# Patient Record
Sex: Female | Born: 1970
Health system: Southern US, Community
[De-identification: ages and names within clinical notes are randomized; demographics above are authoritative.]

## PROBLEM LIST (undated history)

## (undated) DIAGNOSIS — F419 Anxiety disorder, unspecified: Secondary | ICD-10-CM

## (undated) DIAGNOSIS — R519 Headache, unspecified: Secondary | ICD-10-CM

## (undated) DIAGNOSIS — G43909 Migraine, unspecified, not intractable, without status migrainosus: Secondary | ICD-10-CM

## (undated) DIAGNOSIS — E039 Hypothyroidism, unspecified: Secondary | ICD-10-CM

## (undated) HISTORY — PX: LUMBAR FUSION: SHX111

## (undated) HISTORY — PX: BACK SURGERY: SHX140

---

## 2001-09-11 HISTORY — PX: LUMBAR FUSION: SHX111

## 2001-11-04 ENCOUNTER — Other Ambulatory Visit: Admission: RE | Admit: 2001-11-04 | Discharge: 2001-11-04 | Payer: Self-pay | Admitting: Gynecology

## 2002-01-20 ENCOUNTER — Other Ambulatory Visit: Admission: RE | Admit: 2002-01-20 | Discharge: 2002-01-20 | Payer: Self-pay | Admitting: Gynecology

## 2002-06-24 ENCOUNTER — Other Ambulatory Visit: Admission: RE | Admit: 2002-06-24 | Discharge: 2002-06-24 | Payer: Self-pay | Admitting: Gynecology

## 2003-01-28 ENCOUNTER — Encounter: Payer: Self-pay | Admitting: *Deleted

## 2003-01-28 ENCOUNTER — Encounter: Admission: RE | Admit: 2003-01-28 | Discharge: 2003-01-28 | Payer: Self-pay | Admitting: *Deleted

## 2003-02-20 ENCOUNTER — Encounter: Payer: Self-pay | Admitting: Neurosurgery

## 2003-02-20 ENCOUNTER — Inpatient Hospital Stay (HOSPITAL_COMMUNITY): Admission: RE | Admit: 2003-02-20 | Discharge: 2003-02-25 | Payer: Self-pay | Admitting: Neurosurgery

## 2003-07-27 ENCOUNTER — Encounter: Admission: RE | Admit: 2003-07-27 | Discharge: 2003-07-27 | Payer: Self-pay | Admitting: Family Medicine

## 2004-08-15 ENCOUNTER — Other Ambulatory Visit: Admission: RE | Admit: 2004-08-15 | Discharge: 2004-08-15 | Payer: Self-pay | Admitting: *Deleted

## 2005-08-17 ENCOUNTER — Other Ambulatory Visit: Admission: RE | Admit: 2005-08-17 | Discharge: 2005-08-17 | Payer: Self-pay | Admitting: Family Medicine

## 2007-01-15 ENCOUNTER — Other Ambulatory Visit: Admission: RE | Admit: 2007-01-15 | Discharge: 2007-01-15 | Payer: Self-pay | Admitting: Family Medicine

## 2008-05-28 ENCOUNTER — Other Ambulatory Visit: Admission: RE | Admit: 2008-05-28 | Discharge: 2008-05-28 | Payer: Self-pay | Admitting: Family Medicine

## 2010-09-30 ENCOUNTER — Emergency Department: Payer: Self-pay | Admitting: Emergency Medicine

## 2011-01-27 NOTE — Discharge Summary (Signed)
   NAME:  Brandi Deleon                           ACCOUNT NO.:  [id number]   MEDICAL RECORD NO.:  [id number]                   PATIENT TYPE:  INP   LOCATION:  3024                                 FACILITY:  [facility]   PHYSICIAN:  Hilda Lias, M.D.                DATE OF BIRTH:  [birth year]   DATE OF ADMISSION:  02/20/2003  DATE OF DISCHARGE:  02/25/2003                                 DISCHARGE SUMMARY   ADMISSION DIAGNOSES:  1. L4-5, L5-S1 spondylolisthesis and herniated disks.  2. Degenerative disk disease.   FINAL DIAGNOSES:  1. L4-5, L5-S1 spondylolisthesis and herniated disks.  2. Degenerative disk disease.   CLINICAL HISTORY:  The patient was admitted by Dr. Venetia Maxon because of back  pain with radiation down to the legs.  X-ray showed spondylolisthesis,  degenerative disk disease at L4-5, L5-S1; surgery was advised.   LABORATORY DATA:  Normal.   HOSPITAL COURSE:  The patient was taken for surgery, and a fissure over L4-5  and L5-S1 was achieved.  The patient __________ prior to surgery, and today  she is doing much better; she is ambulating, she is afebrile, and she wants  to go home.   DISCHARGE MEDICATIONS:  1. Percocet.  2. Diazepam.  3. Flexeril.   DIET:  Regular.   ACTIVITY:  1. She is not to drive.  2. She is not to [place] any lifting.   FOLLOW UP:  She is going to call the office to set up an appointment to be  seen by Dr. Venetia Maxon.                                               Hilda Lias, M.D.    EB/MEDQ  D:  02/25/2003  T:  02/26/2003  Job:  [id number]

## 2011-01-27 NOTE — Op Note (Signed)
NAME:  Brandi Deleon                           ACCOUNT NO.:  [id number]   MEDICAL RECORD NO.:  [id number]                   PATIENT TYPE:  INP   LOCATION:  2899                                 FACILITY:  [facility]   PHYSICIAN:  Danae Orleans. Venetia Maxon, M.D.               DATE OF BIRTH:  [birth year]   DATE OF PROCEDURE:  02/20/2003  DATE OF DISCHARGE:                                 OPERATIVE REPORT   PREOPERATIVE DIAGNOSIS:  L5 spondylolysis, spondylolisthesis at L5-S1 and  retrolisthesis of L4 and L5 with herniated lumbar disk at L5-S1 and L4-5  with degenerative disk disease and lumbar radiculopathy.   POSTOPERATIVE DIAGNOSIS:  L5 spondylolysis, spondylolisthesis at L5-S1 and  retrolisthesis of L4 and L5 with herniated lumbar disk at L5-S1 and L4-5  with degenerative disk disease and lumbar radiculopathy.   PROCEDURE:  1. L5 Gill procedure.  2. L4-5 and L5-S1 diskectomies.  3. Transverse lumbar interbody fusion with Synthes bone allograft dowels (9     mm at L5-S1 and 11 mm at L4-5) with more slight bone autograft.  4. L4, L5, and S1 bilateral pedicle screw fixation.  5. Posterolateral arthrodesis at L4 through S1 levels with V-toss and     morselized autograft.   SURGEON:  Danae Orleans. Venetia Maxon, M.D.   ANESTHESIA:  General endotracheal anesthesia.   ESTIMATED BLOOD LOSS:  550 mL with one unit of Cellsaver blood returned to  the patient.   COMPLICATIONS:  None.   DISPOSITION:  To recovery room.   INDICATIONS FOR PROCEDURE:  The patient is a 40 year old woman with  spondylolysis at L5, retrolisthesis of L4 and L5, herniated disks at L4-5  and L5-S1 levels with severe leg pain, left leg weakness, and cauda equina  syndrome.  It was elected to take her to surgery for lumbar decompression  and fusion.   DESCRIPTION OF PROCEDURE:  The patient was brought to the operating room.  Following the satisfactory and uncomplicated induction of general  endotracheal anesthesia, placement of  intravenous lines along with a Foley  catheter. The patient was placed in prone position on the operating table.  Her low back was then prepped and draped in the usual sterile fashion.  An  area of plane incision was infiltrated with 0.25% Marcaine and 0.5%  lidocaine with 1:200,000 epinephrine.  Incision was made in the midline and  carried through adipose tissues.  The lumbodorsal fascia was incised  bilaterally.  Subperiosteal dissection was performed exposing the L4 and L5  transverse processes and sacral ala.  First retractor was placed to  facilitate exposure. Intraoperative x-ray confirmed correct level with  markers at L4 and L5 transverse processes.  The L5 posterior elements were  not attached and there was clear evidence of spondylolysis of L5.  The  posterior elements were removed with a resultant decompression of the thecal  sac and both L5 nerve roots were then decompressed  as they extended up the  neuroforamen.  A hemilaminectomy of L4 was performed on the left with  exposure of the L4-5 interspace at this level.  Using loupe magnification, a  large free fragment of herniated disk material was removed from within the  axilla of the L5 nerve root on the left and subsequently the L5-S1  interspace was incised and disk material was removed in a piecemeal fashion.  A similar decompression was performed at the L4-5 level.  Using the  fluoroscopy unit to confirm positioning of bone grafts and subsequently with  pedicle screw fixation, a 9 mm Synthes bone dowel was inserted into the  interspace at the L5-S1 level after preparing the interspace appropriately.  Morselized bone autograft was placed overlying this.  Subsequently, at the  L4-5 level, an 11 mm bone graft was placed and countersunk appropriately and  morselized bone autograft was placed overlying this.  Subsequently pedicle  screw fixation was placed with screws at L4, L5, and sacral levels  bilaterally.  5.75 x 40 mm  screws were placed at each level.  Positioning of  screws was confirmed on lateral and AP fluoroscopy.  A 60 mm rod was then  lordosed appropriately and affixed to the screwheads after the V-toss strips  were placed laterally along with the remaining morselized bone autograft. V-  toss strips were reconstituted in marrow blood aspirated from the pedicle  screw tract.  The self-retaining retractor was then removed after the  locking caps were placed on each of the screws and they were locked down in  situ. The retractor was removed.  The lumbodorsal fascia was closed with 1  Vicryl suture. Subcutaneous tissues were reapproximated with 2-0 Vicryl  interrupted inverted sutures.  The skin edges were reapproximated with  interrupted 3-0 Vicryl subcuticular stitch.  The wound was dressed with  Benzoin, Steri-Strips, Telfa gauze and tape.  The patient was extubated in  the operating room and taken to the recovery room in stable and satisfactory  condition having tolerated the procedure well.  Needle, sponge, and  instrument count correct.                                               Danae Orleans. Venetia Maxon, M.D.    JDS/MEDQ  D:  02/20/2003  T:  02/21/2003  Job:  [id number]

## 2012-08-15 ENCOUNTER — Ambulatory Visit: Payer: Self-pay | Admitting: Internal Medicine

## 2012-09-06 ENCOUNTER — Ambulatory Visit: Payer: Self-pay | Admitting: Internal Medicine

## 2019-03-11 ENCOUNTER — Other Ambulatory Visit: Payer: Self-pay | Admitting: Internal Medicine

## 2019-03-11 DIAGNOSIS — Z1231 Encounter for screening mammogram for malignant neoplasm of breast: Secondary | ICD-10-CM

## 2019-04-17 ENCOUNTER — Encounter (HOSPITAL_COMMUNITY): Payer: Self-pay

## 2019-04-17 ENCOUNTER — Ambulatory Visit
Admission: RE | Admit: 2019-04-17 | Discharge: 2019-04-17 | Disposition: A | Discharge status: Home / Self Care | Payer: Commercial Managed Care - PPO | Source: Ambulatory Visit | Attending: Internal Medicine | Admitting: Internal Medicine

## 2019-04-17 DIAGNOSIS — Z1231 Encounter for screening mammogram for malignant neoplasm of breast: Secondary | ICD-10-CM | POA: Diagnosis present

## 2019-07-21 ENCOUNTER — Other Ambulatory Visit: Payer: Self-pay | Admitting: Internal Medicine

## 2019-07-21 DIAGNOSIS — M545 Low back pain, unspecified: Secondary | ICD-10-CM

## 2019-07-29 ENCOUNTER — Other Ambulatory Visit: Payer: Self-pay

## 2019-07-29 ENCOUNTER — Ambulatory Visit
Admission: RE | Admit: 2019-07-29 | Discharge: 2019-07-29 | Disposition: A | Discharge status: Home / Self Care | Payer: Commercial Managed Care - PPO | Source: Ambulatory Visit | Attending: Internal Medicine | Admitting: Internal Medicine

## 2019-07-29 DIAGNOSIS — M545 Low back pain, unspecified: Secondary | ICD-10-CM

## 2019-12-01 ENCOUNTER — Other Ambulatory Visit: Payer: Self-pay | Admitting: Family

## 2019-12-01 DIAGNOSIS — E049 Nontoxic goiter, unspecified: Secondary | ICD-10-CM

## 2019-12-08 ENCOUNTER — Other Ambulatory Visit: Payer: Self-pay

## 2019-12-08 ENCOUNTER — Ambulatory Visit
Admission: RE | Admit: 2019-12-08 | Discharge: 2019-12-08 | Disposition: A | Discharge status: Home / Self Care | Payer: Commercial Managed Care - PPO | Source: Ambulatory Visit | Attending: Family | Admitting: Family

## 2019-12-08 DIAGNOSIS — E049 Nontoxic goiter, unspecified: Secondary | ICD-10-CM

## 2021-04-28 ENCOUNTER — Other Ambulatory Visit: Payer: Self-pay | Admitting: Family

## 2021-04-28 DIAGNOSIS — Z1231 Encounter for screening mammogram for malignant neoplasm of breast: Secondary | ICD-10-CM

## 2021-05-13 ENCOUNTER — Other Ambulatory Visit: Payer: Self-pay

## 2021-05-13 ENCOUNTER — Ambulatory Visit
Admission: RE | Admit: 2021-05-13 | Discharge: 2021-05-13 | Disposition: A | Discharge status: Home / Self Care | Payer: Commercial Managed Care - PPO | Source: Ambulatory Visit | Attending: Family | Admitting: Family

## 2021-05-13 DIAGNOSIS — Z1231 Encounter for screening mammogram for malignant neoplasm of breast: Secondary | ICD-10-CM | POA: Insufficient documentation

## 2022-02-13 ENCOUNTER — Ambulatory Visit
Admission: EM | Admit: 2022-02-13 | Discharge: 2022-02-13 | Disposition: A | Discharge status: Home / Self Care | Payer: Commercial Managed Care - PPO | Attending: Emergency Medicine | Admitting: Emergency Medicine

## 2022-02-13 ENCOUNTER — Other Ambulatory Visit: Payer: Self-pay

## 2022-02-13 ENCOUNTER — Emergency Department: Payer: Commercial Managed Care - PPO

## 2022-02-13 ENCOUNTER — Emergency Department: Payer: Commercial Managed Care - PPO | Admitting: Certified Registered"

## 2022-02-13 ENCOUNTER — Encounter
Admission: EM | Disposition: A | Discharge status: Home / Self Care | Payer: Self-pay | Source: Home / Self Care | Attending: Emergency Medicine

## 2022-02-13 ENCOUNTER — Encounter: Payer: Self-pay | Admitting: Obstetrics and Gynecology

## 2022-02-13 DIAGNOSIS — E669 Obesity, unspecified: Secondary | ICD-10-CM | POA: Diagnosis not present

## 2022-02-13 DIAGNOSIS — N83202 Unspecified ovarian cyst, left side: Secondary | ICD-10-CM | POA: Insufficient documentation

## 2022-02-13 DIAGNOSIS — Z6832 Body mass index (BMI) 32.0-32.9, adult: Secondary | ICD-10-CM | POA: Diagnosis not present

## 2022-02-13 DIAGNOSIS — N8311 Corpus luteum cyst of right ovary: Secondary | ICD-10-CM | POA: Insufficient documentation

## 2022-02-13 DIAGNOSIS — R1031 Right lower quadrant pain: Secondary | ICD-10-CM | POA: Diagnosis not present

## 2022-02-13 DIAGNOSIS — E039 Hypothyroidism, unspecified: Secondary | ICD-10-CM | POA: Diagnosis not present

## 2022-02-13 DIAGNOSIS — N838 Other noninflammatory disorders of ovary, fallopian tube and broad ligament: Secondary | ICD-10-CM | POA: Insufficient documentation

## 2022-02-13 DIAGNOSIS — N83519 Torsion of ovary and ovarian pedicle, unspecified side: Secondary | ICD-10-CM

## 2022-02-13 DIAGNOSIS — N83201 Unspecified ovarian cyst, right side: Secondary | ICD-10-CM | POA: Diagnosis present

## 2022-02-13 DIAGNOSIS — N83511 Torsion of right ovary and ovarian pedicle: Secondary | ICD-10-CM | POA: Diagnosis present

## 2022-02-13 DIAGNOSIS — D270 Benign neoplasm of right ovary: Secondary | ICD-10-CM | POA: Insufficient documentation

## 2022-02-13 HISTORY — DX: Hypothyroidism, unspecified: E03.9

## 2022-02-13 HISTORY — PX: OVARIAN CYST REMOVAL: SHX89

## 2022-02-13 HISTORY — PX: CYSTOSCOPY: SHX5120

## 2022-02-13 HISTORY — PX: ROBOTIC ASSISTED SALPINGO OOPHERECTOMY: SHX6082

## 2022-02-13 LAB — COMPREHENSIVE METABOLIC PANEL
ALT: 18 U/L (ref 0–44)
AST: 20 U/L (ref 15–41)
Albumin: 4.2 g/dL (ref 3.5–5.0)
Alkaline Phosphatase: 55 U/L (ref 38–126)
Anion gap: 6 (ref 5–15)
BUN: 14 mg/dL (ref 6–20)
CO2: 24 mmol/L (ref 22–32)
Calcium: 9.4 mg/dL (ref 8.9–10.3)
Chloride: 107 mmol/L (ref 98–111)
Creatinine, Ser: 0.79 mg/dL (ref 0.44–1.00)
GFR, Estimated: >60 mL/min (ref >60)
Glucose, Bld: 143 mg/dL — ABNORMAL HIGH (ref 70–99)
Potassium: 4 mmol/L (ref 3.5–5.1)
Sodium: 137 mmol/L (ref 135–145)
Total Bilirubin: 0.9 mg/dL (ref 0.3–1.2)
Total Protein: 7.8 g/dL (ref 6.5–8.1)

## 2022-02-13 LAB — URINALYSIS, ROUTINE W REFLEX MICROSCOPIC
Bacteria, UA: NONE SEEN
Bilirubin Urine: NEGATIVE
Glucose, UA: NEGATIVE mg/dL
Ketones, ur: 20 mg/dL — AB
Leukocytes,Ua: NEGATIVE
Nitrite: NEGATIVE
Protein, ur: NEGATIVE mg/dL
Specific Gravity, Urine: >1.046 — ABNORMAL HIGH (ref 1.005–1.030)
pH: 5 (ref 5.0–8.0)

## 2022-02-13 LAB — CBC
HCT: 47.1 % — ABNORMAL HIGH (ref 36.0–46.0)
Hemoglobin: 15.8 g/dL — ABNORMAL HIGH (ref 12.0–15.0)
MCH: 28.6 pg (ref 26.0–34.0)
MCHC: 33.5 g/dL (ref 30.0–36.0)
MCV: 85.3 fL (ref 80.0–100.0)
Platelets: 278 10*3/uL (ref 150–400)
RBC: 5.52 MIL/uL — ABNORMAL HIGH (ref 3.87–5.11)
RDW: 12.5 % (ref 11.5–15.5)
WBC: 8.5 10*3/uL (ref 4.0–10.5)
nRBC: 0 % (ref 0.0–0.2)

## 2022-02-13 LAB — HCG, QUANTITATIVE, PREGNANCY: hCG, Beta Chain, Quant, S: 1 m[IU]/mL (ref <5)

## 2022-02-13 LAB — LIPASE, BLOOD: Lipase: 32 U/L (ref 11–51)

## 2022-02-13 SURGERY — SALPINGO-OOPHORECTOMY, ROBOT-ASSISTED
Anesthesia: General | Site: Abdomen | Laterality: Right

## 2022-02-13 MED ORDER — MIDAZOLAM HCL 2 MG/2ML IJ SOLN
INTRAMUSCULAR | Status: DC | PRN
  Administered 2022-02-13: 2 mg via INTRAVENOUS

## 2022-02-13 MED ORDER — FENTANYL CITRATE (PF) 100 MCG/2ML IJ SOLN
INTRAMUSCULAR | Status: AC
  Filled 2022-02-13: qty 2

## 2022-02-13 MED ORDER — HYDROMORPHONE HCL 1 MG/ML IJ SOLN
INTRAMUSCULAR | Status: AC
  Filled 2022-02-13: qty 0.5

## 2022-02-13 MED ORDER — HYDROMORPHONE HCL 1 MG/ML IJ SOLN
0.5000 mg | Freq: Once | INTRAMUSCULAR | Status: AC
  Administered 2022-02-13: 0.5 mg via INTRAVENOUS
  Filled 2022-02-13: qty 0.5

## 2022-02-13 MED ORDER — MIDAZOLAM HCL 2 MG/2ML IJ SOLN
INTRAMUSCULAR | Status: AC
  Filled 2022-02-13: qty 2

## 2022-02-13 MED ORDER — IBUPROFEN 600 MG PO TABS: 600.0000 mg | ORAL_TABLET | Freq: Four times a day (QID) | ORAL | 0 refills | Status: DC

## 2022-02-13 MED ORDER — KETAMINE HCL 10 MG/ML IJ SOLN
INTRAMUSCULAR | Status: DC | PRN
  Administered 2022-02-13 (×2): 10 mg via INTRAVENOUS
  Administered 2022-02-13 (×2): 20 mg via INTRAVENOUS
  Administered 2022-02-13: 10 mg via INTRAVENOUS

## 2022-02-13 MED ORDER — OXYCODONE HCL 5 MG PO TABS: 5.0000 mg | ORAL_TABLET | Freq: Once | ORAL | Status: DC | PRN

## 2022-02-13 MED ORDER — ONDANSETRON HCL 4 MG/2ML IJ SOLN
4.0000 mg | Freq: Once | INTRAMUSCULAR | Status: AC
  Administered 2022-02-13: 4 mg via INTRAVENOUS
  Filled 2022-02-13: qty 2

## 2022-02-13 MED ORDER — FENTANYL CITRATE (PF) 100 MCG/2ML IJ SOLN
INTRAMUSCULAR | Status: AC
  Administered 2022-02-13: 25 ug via INTRAVENOUS
  Filled 2022-02-13: qty 2

## 2022-02-13 MED ORDER — SODIUM CHLORIDE 0.9 % IR SOLN
Status: DC | PRN
  Administered 2022-02-13: 100 mL

## 2022-02-13 MED ORDER — POVIDONE-IODINE 10 % EX SWAB: 2.0000 "application " | Freq: Once | CUTANEOUS | Status: DC

## 2022-02-13 MED ORDER — DEXAMETHASONE SODIUM PHOSPHATE 10 MG/ML IJ SOLN
INTRAMUSCULAR | Status: DC | PRN
  Administered 2022-02-13: 8 mg via INTRAVENOUS

## 2022-02-13 MED ORDER — SUGAMMADEX SODIUM 500 MG/5ML IV SOLN
INTRAVENOUS | Status: DC | PRN
  Administered 2022-02-13: 450 mg via INTRAVENOUS

## 2022-02-13 MED ORDER — SCOPOLAMINE 1 MG/3DAYS TD PT72
1.0000 | MEDICATED_PATCH | TRANSDERMAL | Status: DC
  Administered 2022-02-13: 1.5 mg via TRANSDERMAL

## 2022-02-13 MED ORDER — FENTANYL CITRATE (PF) 100 MCG/2ML IJ SOLN
INTRAMUSCULAR | Status: DC | PRN
Start: 2022-02-13 — End: 2022-02-13
  Administered 2022-02-13 (×4): 25 ug via INTRAVENOUS

## 2022-02-13 MED ORDER — ONDANSETRON HCL 4 MG/2ML IJ SOLN: 4.0000 mg | Freq: Once | INTRAMUSCULAR | Status: DC | PRN

## 2022-02-13 MED ORDER — PROPOFOL 10 MG/ML IV BOLUS
INTRAVENOUS | Status: DC | PRN
  Administered 2022-02-13: 160 mg via INTRAVENOUS

## 2022-02-13 MED ORDER — ONDANSETRON HCL 4 MG/2ML IJ SOLN
INTRAMUSCULAR | Status: DC | PRN
  Administered 2022-02-13: 4 mg via INTRAVENOUS

## 2022-02-13 MED ORDER — HYDROMORPHONE HCL 1 MG/ML IJ SOLN
INTRAMUSCULAR | Status: DC | PRN
  Administered 2022-02-13 (×2): .5 mg via INTRAVENOUS

## 2022-02-13 MED ORDER — MORPHINE SULFATE (PF) 4 MG/ML IV SOLN: 4.0000 mg | Freq: Once | INTRAVENOUS | Status: DC

## 2022-02-13 MED ORDER — LIDOCAINE HCL (CARDIAC) PF 100 MG/5ML IV SOSY
PREFILLED_SYRINGE | INTRAVENOUS | Status: DC | PRN
  Administered 2022-02-13: 100 mg via INTRAVENOUS

## 2022-02-13 MED ORDER — KETAMINE HCL 50 MG/5ML IJ SOSY
PREFILLED_SYRINGE | INTRAMUSCULAR | Status: AC
Start: 2022-02-13 — End: ?
  Filled 2022-02-13: qty 5

## 2022-02-13 MED ORDER — LACTATED RINGERS IV SOLN: INTRAVENOUS | Status: DC

## 2022-02-13 MED ORDER — ROCURONIUM BROMIDE 100 MG/10ML IV SOLN
INTRAVENOUS | Status: DC | PRN
  Administered 2022-02-13: 30 mg via INTRAVENOUS
  Administered 2022-02-13 (×3): 20 mg via INTRAVENOUS
  Administered 2022-02-13: 50 mg via INTRAVENOUS

## 2022-02-13 MED ORDER — ACETAMINOPHEN 10 MG/ML IV SOLN
INTRAVENOUS | Status: DC | PRN
  Administered 2022-02-13: 1000 mg via INTRAVENOUS

## 2022-02-13 MED ORDER — BUPIVACAINE HCL 0.5 % IJ SOLN
INTRAMUSCULAR | Status: DC | PRN
  Administered 2022-02-13: 10 mL

## 2022-02-13 MED ORDER — METOPROLOL TARTRATE 5 MG/5ML IV SOLN
INTRAVENOUS | Status: DC | PRN
  Administered 2022-02-13: 1 mg via INTRAVENOUS
  Administered 2022-02-13: 2 mg via INTRAVENOUS
  Administered 2022-02-13 (×2): 1 mg via INTRAVENOUS

## 2022-02-13 MED ORDER — SUGAMMADEX SODIUM 500 MG/5ML IV SOLN
INTRAVENOUS | Status: AC
Start: 2022-02-13 — End: ?
  Filled 2022-02-13: qty 5

## 2022-02-13 MED ORDER — ONDANSETRON 4 MG PO TBDP: 4.0000 mg | ORAL_TABLET | Freq: Four times a day (QID) | ORAL | 0 refills | Status: DC | PRN

## 2022-02-13 MED ORDER — PROPOFOL 500 MG/50ML IV EMUL
INTRAVENOUS | Status: DC | PRN
  Administered 2022-02-13: 150 ug/kg/min via INTRAVENOUS

## 2022-02-13 MED ORDER — LACTATED RINGERS IV BOLUS
1000.0000 mL | Freq: Once | INTRAVENOUS | Status: AC
  Administered 2022-02-13: 1000 mL via INTRAVENOUS

## 2022-02-13 MED ORDER — FENTANYL CITRATE PF 50 MCG/ML IJ SOSY
50.0000 ug | PREFILLED_SYRINGE | INTRAMUSCULAR | Status: AC | PRN
  Administered 2022-02-13 (×2): 50 ug via INTRAVENOUS
  Filled 2022-02-13 (×2): qty 1

## 2022-02-13 MED ORDER — FENTANYL CITRATE (PF) 100 MCG/2ML IJ SOLN
25.0000 ug | INTRAMUSCULAR | Status: DC | PRN
  Administered 2022-02-13: 25 ug via INTRAVENOUS

## 2022-02-13 MED ORDER — PROPOFOL 1000 MG/100ML IV EMUL
INTRAVENOUS | Status: AC
Start: 2022-02-13 — End: ?
  Filled 2022-02-13: qty 100

## 2022-02-13 MED ORDER — SILVER NITRATE-POT NITRATE 75-25 % EX MISC
CUTANEOUS | Status: DC | PRN
  Administered 2022-02-13: 2 via TOPICAL

## 2022-02-13 MED ORDER — HYDROMORPHONE HCL 1 MG/ML IJ SOLN
INTRAMUSCULAR | Status: AC
  Filled 2022-02-13: qty 1

## 2022-02-13 MED ORDER — OXYCODONE-ACETAMINOPHEN 5-325 MG PO TABS: 1.0000 | ORAL_TABLET | Freq: Four times a day (QID) | ORAL | 0 refills | Status: AC | PRN

## 2022-02-13 MED ORDER — IOHEXOL 300 MG/ML  SOLN
100.0000 mL | Freq: Once | INTRAMUSCULAR | Status: AC | PRN
  Administered 2022-02-13: 100 mL via INTRAVENOUS

## 2022-02-13 MED ORDER — KETAMINE HCL 50 MG/5ML IJ SOSY
PREFILLED_SYRINGE | INTRAMUSCULAR | Status: AC
  Filled 2022-02-13: qty 5

## 2022-02-13 MED ORDER — HYDROMORPHONE HCL 1 MG/ML IJ SOLN
0.5000 mg | Freq: Once | INTRAMUSCULAR | Status: AC
  Administered 2022-02-13: 0.5 mg via INTRAVENOUS

## 2022-02-13 MED ORDER — PHENYLEPHRINE HCL (PRESSORS) 10 MG/ML IV SOLN
INTRAVENOUS | Status: DC | PRN
  Administered 2022-02-13: 80 ug via INTRAVENOUS

## 2022-02-13 MED ORDER — FENTANYL CITRATE PF 50 MCG/ML IJ SOSY
50.0000 ug | PREFILLED_SYRINGE | Freq: Once | INTRAMUSCULAR | Status: AC
  Administered 2022-02-13: 50 ug via INTRAVENOUS

## 2022-02-13 MED ORDER — OXYCODONE HCL 5 MG/5ML PO SOLN: 5.0000 mg | Freq: Once | ORAL | Status: DC | PRN

## 2022-02-13 MED ORDER — ACETAMINOPHEN 10 MG/ML IV SOLN: 1000.0000 mg | Freq: Once | INTRAVENOUS | Status: DC | PRN

## 2022-02-13 SURGICAL SUPPLY — 91 items
ADH SKN CLS APL DERMABOND .7 (GAUZE/BANDAGES/DRESSINGS) ×3
ANCHOR TIS RET SYS 1550ML (BAG) ×1 IMPLANT
APL PRP STRL LF DISP 70% ISPRP (MISCELLANEOUS) ×3
BACTOSHIELD CHG 4% 4OZ (MISCELLANEOUS) ×1
BAG DRN RND TRDRP ANRFLXCHMBR (UROLOGICAL SUPPLIES) ×3
BAG SPEC RTRVL C1550 25.4 (BAG) ×3
BAG URINE DRAIN 2000ML AR STRL (UROLOGICAL SUPPLIES) ×4 IMPLANT
BASIN KIT SINGLE STR (MISCELLANEOUS) ×4 IMPLANT
BLADE SURG SZ11 CARB STEEL (BLADE) ×4 IMPLANT
CANNULA CAP OBTURATR AIRSEAL 8 (CAP) ×4 IMPLANT
CANNULA REDUC XI 12-8 STAPL (CANNULA) ×4
CANNULA REDUCER 12-8 DVNC XI (CANNULA) IMPLANT
CATH FOLEY 2WAY  5CC 16FR (CATHETERS) ×4
CATH FOLEY 2WAY 5CC 16FR (CATHETERS) ×3
CATH URTH 16FR FL 2W BLN LF (CATHETERS) ×3 IMPLANT
CHLORAPREP W/TINT 26 (MISCELLANEOUS) ×4 IMPLANT
COVER MAYO STAND REUSABLE (DRAPES) ×4 IMPLANT
COVER TIP SHEARS 8 DVNC (MISCELLANEOUS) ×3 IMPLANT
COVER TIP SHEARS 8MM DA VINCI (MISCELLANEOUS) ×4
DEFOGGER SCOPE WARMER CLEARIFY (MISCELLANEOUS) ×4 IMPLANT
DERMABOND ADVANCED (GAUZE/BANDAGES/DRESSINGS) ×1
DERMABOND ADVANCED .7 DNX12 (GAUZE/BANDAGES/DRESSINGS) ×3 IMPLANT
DRAPE 3/4 80X56 (DRAPES) IMPLANT
DRAPE ARM DVNC X/XI (DISPOSABLE) ×12 IMPLANT
DRAPE DA VINCI XI ARM (DISPOSABLE) ×16
DRAPE LEGGINS SURG 28X43 STRL (DRAPES) ×4 IMPLANT
DRAPE ROBOT W/ LEGGING 30X125 (DRAPES) ×4 IMPLANT
DRAPE UNDER BUTTOCK W/FLU (DRAPES) ×4 IMPLANT
DRSG TEGADERM 2-3/8X2-3/4 SM (GAUZE/BANDAGES/DRESSINGS) ×1 IMPLANT
ELECT REM PT RETURN 9FT ADLT (ELECTROSURGICAL) ×4
ELECTRODE REM PT RTRN 9FT ADLT (ELECTROSURGICAL) ×3 IMPLANT
GLOVE BIO SURGEON STRL SZ7 (GLOVE) ×12 IMPLANT
GLOVE SURG UNDER POLY LF SZ7.5 (GLOVE) ×12 IMPLANT
GOWN STRL REUS W/ TWL LRG LVL3 (GOWN DISPOSABLE) ×18 IMPLANT
GOWN STRL REUS W/TWL LRG LVL3 (GOWN DISPOSABLE) ×24
GRASPER SUT TROCAR 14GX15 (MISCELLANEOUS) IMPLANT
IRRIGATION STRYKERFLOW (MISCELLANEOUS) IMPLANT
IRRIGATOR STRYKERFLOW (MISCELLANEOUS) ×4
IV NS 1000ML (IV SOLUTION) ×4
IV NS 1000ML BAXH (IV SOLUTION) ×3 IMPLANT
KIT PINK PAD W/HEAD ARE REST (MISCELLANEOUS) ×4 IMPLANT
KIT PINK PAD W/HEAD ARM REST (MISCELLANEOUS) ×3 IMPLANT
KIT TURNOVER CYSTO (KITS) ×4 IMPLANT
LABEL OR SOLS (LABEL) ×4 IMPLANT
MANIFOLD NEPTUNE II (INSTRUMENTS) ×4 IMPLANT
MANIPULATOR URINE KRONNER 5 (MISCELLANEOUS) IMPLANT
MANIPULATOR VCARE LG CRV RETR (MISCELLANEOUS) IMPLANT
MANIPULATOR VCARE SML CRV RETR (MISCELLANEOUS) IMPLANT
MANIPULATOR VCARE STD CRV RETR (MISCELLANEOUS) IMPLANT
NEEDLE HYPO 22GX1.5 SAFETY (NEEDLE) ×4 IMPLANT
NS IRRIG 1000ML POUR BTL (IV SOLUTION) ×8 IMPLANT
NS IRRIG 500ML POUR BTL (IV SOLUTION) ×4 IMPLANT
OBTURATOR OPTICAL STANDARD 8MM (TROCAR) ×4
OBTURATOR OPTICAL STND 8 DVNC (TROCAR) ×3
OBTURATOR OPTICALSTD 8 DVNC (TROCAR) ×3 IMPLANT
OCCLUDER COLPOPNEUMO (BALLOONS) ×4 IMPLANT
PACK LAP CHOLECYSTECTOMY (MISCELLANEOUS) ×4 IMPLANT
PAD OB MATERNITY 4.3X12.25 (PERSONAL CARE ITEMS) ×4 IMPLANT
PAD PREP 24X41 OB/GYN DISP (PERSONAL CARE ITEMS) ×4 IMPLANT
SCISSORS METZENBAUM CVD 33 (INSTRUMENTS) IMPLANT
SCRUB CHG 4% DYNA-HEX 4OZ (MISCELLANEOUS) ×3 IMPLANT
SEAL CANN UNIV 5-8 DVNC XI (MISCELLANEOUS) ×12 IMPLANT
SEAL XI 5MM-8MM UNIVERSAL (MISCELLANEOUS) ×16
SEALER VESSEL DA VINCI XI (MISCELLANEOUS) ×4
SEALER VESSEL EXT DVNC XI (MISCELLANEOUS) ×3 IMPLANT
SET TUBE FILTERED XL AIRSEAL (SET/KITS/TRAYS/PACK) ×4 IMPLANT
SET TUBE SMOKE EVAC HIGH FLOW (TUBING) ×1 IMPLANT
SOLUTION ELECTROLUBE (MISCELLANEOUS) ×4 IMPLANT
SPONGE T-LAP 18X18 ~~LOC~~+RFID (SPONGE) ×4 IMPLANT
STAPLER CANNULA SEAL DVNC XI (STAPLE) IMPLANT
STAPLER CANNULA SEAL XI (STAPLE) ×4
SURGILUBE 2OZ TUBE FLIPTOP (MISCELLANEOUS) ×4 IMPLANT
SUT DVC VLOC 180 0 12IN GS21 (SUTURE) ×8
SUT MNCRL 4-0 (SUTURE) ×8
SUT MNCRL 4-0 27XMFL (SUTURE) ×6
SUT STRATAFIX 0 PDS+ CT-2 23 (SUTURE) ×4
SUT VIC AB 0 CT2 27 (SUTURE) ×4 IMPLANT
SUT VIC AB 1 CT1 36 (SUTURE) ×8 IMPLANT
SUT VIC AB 2-0 CT1 27 (SUTURE) ×4
SUT VIC AB 2-0 CT1 TAPERPNT 27 (SUTURE) ×3 IMPLANT
SUT VIC AB 4-0 SH 27 (SUTURE) ×8
SUT VIC AB 4-0 SH 27XANBCTRL (SUTURE) ×6 IMPLANT
SUT VICRYL 0 AB UR-6 (SUTURE) ×5 IMPLANT
SUTURE DVC VLC 180 0 12IN GS21 (SUTURE) ×6 IMPLANT
SUTURE MNCRL 4-0 27XMF (SUTURE) ×3 IMPLANT
SUTURE STRATFX 0 PDS+ CT-2 23 (SUTURE) ×3 IMPLANT
SYR 10ML LL (SYRINGE) ×4 IMPLANT
SYR 50ML LL SCALE MARK (SYRINGE) ×4 IMPLANT
TUBING EVAC SMOKE HEATED PNEUM (TUBING) ×4 IMPLANT
WAND RF SURG SPNG DETECT SYS (INSTRUMENTS) ×4 IMPLANT
WATER STERILE IRR 500ML POUR (IV SOLUTION) ×4 IMPLANT

## 2022-02-13 NOTE — ED Provider Notes (Signed)
[facility] Provider Note    Event Date/Time   First MD Initiated Contact with Patient 02/13/22 [phone]     (approximate)   History   Chief Complaint Abdominal Pain   HPI  Brandi Deleon is a 51 y.o. female with past medical history of migraines and hypothyroidism who presents to the ED complaining of abdominal pain.  Patient reports that she had acute onset of pain in the right lower quadrant of her abdomen around 10 PM last night.  Pain has been present intermittently in waves since then, seems to have gotten acutely worse over the past 45 minutes.  She has been feeling nauseous and has had multiple episodes of vomiting, denies any changes in her bowel movements.  She has not had any fevers, flank pain, dysuria, or hematuria.  She denies history of similar symptoms and has never had surgery on her abdomen.      Physical Exam   Triage Vital Signs: ED Triage Vitals  Enc Vitals Group     BP 02/13/22 0634 (!) 135/91     Pulse Rate 02/13/22 0634 98     Resp 02/13/22 0634 (!) 22     Temp --      Temp src --      SpO2 02/13/22 0634 99 %     Weight 02/13/22 0502 240 lb (108.9 kg)     Height 02/13/22 0502 6' (1.829 m)     Head Circumference --      Peak Flow --      Pain Score 02/13/22 0545 4     Pain Loc --      Pain Edu? --      Excl. in [place]? --     Most recent vital signs: Vitals:   02/13/22 0730 02/13/22 1030  BP: (!) 152/88 (!) 150/87  Pulse: 94 94  Resp: 20 18  Temp: 98.5 F (36.9 C)   SpO2: 100% 97%    Constitutional: Alert and oriented. Eyes: Conjunctivae are normal. Head: Atraumatic. Nose: No congestion/rhinnorhea. Mouth/Throat: Mucous membranes are moist.  Cardiovascular: Normal rate, regular rhythm. Grossly normal heart sounds.  2+ radial pulses bilaterally. Respiratory: Normal respiratory effort.  No retractions. Lungs CTAB. Gastrointestinal: Soft and tender to palpation diffusely, greatest in the right lower quadrant  with voluntary guarding.  No CVA tenderness bilaterally.  No distention. Musculoskeletal: No lower extremity tenderness nor edema.  Neurologic:  Normal speech and language. No gross focal neurologic deficits are appreciated.    ED Results / Procedures / Treatments   Labs (all labs ordered are listed, but only abnormal results are displayed) Labs Reviewed  COMPREHENSIVE METABOLIC PANEL - Abnormal; Notable for the following components:      Result Value   Glucose, Bld 143 (*)    All other components within normal limits  CBC - Abnormal; Notable for the following components:   RBC 5.52 (*)    Hemoglobin 15.8 (*)    HCT 47.1 (*)    All other components within normal limits  LIPASE, BLOOD  HCG, QUANTITATIVE, PREGNANCY  URINALYSIS, ROUTINE W REFLEX MICROSCOPIC   RADIOLOGY CT of abdomen/pelvis reviewed by me and shows bilateral ovarian cyst with inflammatory changes noted on the right, no inflammatory changes noted in the area of the appendix.  PROCEDURES:  Critical Care performed: Yes, see critical care procedure note(s)  .Critical Care Performed by: Blake Divine, MD Authorized by: Blake Divine, MD   Critical care provider statement:    Critical care  time (minutes):  30   Critical care time was exclusive of:  Separately billable procedures and treating other patients and teaching time   Critical care was necessary to treat or prevent imminent or life-threatening deterioration of the following conditions: Ovarian torsion.   Critical care was time spent personally by me on the following activities:  Development of treatment plan with patient or surrogate, discussions with consultants, evaluation of patient's response to treatment, examination of patient, ordering and review of laboratory studies, ordering and review of radiographic studies, ordering and performing treatments and interventions, pulse oximetry, re-evaluation of patient's condition and review of old charts   I  assumed direction of critical care for this patient from another provider in my specialty: no     Care discussed with: admitting provider     MEDICATIONS ORDERED IN ED: Medications  morphine (PF) 4 MG/ML injection 4 mg (has no administration in time range)  fentaNYL (SUBLIMAZE) injection 50 mcg (50 mcg Intravenous Given 02/13/22 0609)  ondansetron (ZOFRAN) injection 4 mg (4 mg Intravenous Given 02/13/22 0520)  HYDROmorphone (DILAUDID) injection 0.5 mg (0.5 mg Intravenous Given 02/13/22 0650)  ondansetron (ZOFRAN) injection 4 mg (4 mg Intravenous Given 02/13/22 0650)  HYDROmorphone (DILAUDID) injection 0.5 mg (0.5 mg Intravenous Given 02/13/22 0730)  lactated ringers bolus 1,000 mL (0 mLs Intravenous Stopped 02/13/22 1009)  iohexol (OMNIPAQUE) 300 MG/ML solution 100 mL (100 mLs Intravenous Contrast Given 02/13/22 0857)  HYDROmorphone (DILAUDID) injection 0.5 mg (0.5 mg Intravenous Given 02/13/22 1009)     IMPRESSION / MDM / [place] / ED COURSE  I reviewed the triage vital signs and the nursing notes.                              51 y.o. female with past medical history of migraines and hypothyroidism who presents to the ED for intermittent abdominal pain since 10 PM last night, severe over the past 45 minutes and associated with vomiting.  Patient's presentation is most consistent with acute presentation with potential threat to life or bodily function.  Differential diagnosis includes, but is not limited to, appendicitis, kidney stones, pyelonephritis, cholecystitis, biliary colic, cystitis, ectopic pregnancy.  Patient uncomfortable appearing but in no acute distress, vital signs are unremarkable.  She has significant tenderness diffusely across her abdomen, greatest in the right lower quadrant.  She has been unable to provide a urine sample thus far, denies any urinary symptoms but we will add on beta hCG to ensure she is not pregnant.  Plan to further assess with CT scan for appendicitis,  kidney stone, or other explanation for her pain.  Labs thus far are reassuring with CBC showing no anemia or leukocytosis, BMP without electrolyte abnormality or AKI, LFTs and lipase are within normal limits.  She has received 0.5 mg IV Dilaudid for pain and continues to be significantly uncomfortable, we will give a another 0.5 mg of IV Dilaudid.  Nausea improved following Zofran, we will hydrate with IV fluids.  I spoke with radiology regarding patient's CT scan, which shows bilateral ovarian cysts with inflammatory changes on the right concerning for possible torsion.  No evidence of appendicitis or kidney stone to otherwise explain patient's pain.  We will follow-up with ultrasound and case discussed with Dr. Glennon Mac of OB/GYN, who will evaluate the patient.  Patient continues to have significant pain on reassessment and we will give another dose of IV Dilaudid.  Patient evaluated by OB/GYN,  who will plan to take patient to the OR for salpingectomy and oophorectomy. We will give morphine for additional pain control until patient goes to the OR.      FINAL CLINICAL IMPRESSION(S) / ED DIAGNOSES   Final diagnoses:  Ovarian torsion     Rx / DC Orders   ED Discharge Orders     None        Note:  This document was prepared using Dragon voice recognition software and may include unintentional dictation errors.   Blake Divine, MD 02/13/22 1101

## 2022-02-13 NOTE — ED Triage Notes (Signed)
Patient with lower abdominal pain for several hours with nausea and vomiting.

## 2022-02-13 NOTE — ED Notes (Signed)
Patient to ER by EMS for lower abd pain.  EMS interventions -- bp - 140/90, pulse - 100, pulse oxi -100% on room air, temp - 97.5.  Patient reports pain comes in waves with nausea, vomiting and dizziness.

## 2022-02-13 NOTE — Anesthesia Postprocedure Evaluation (Signed)
Anesthesia Post Note  Patient: Brandi Deleon  Procedure(s) Performed: ROBOTIC ASSISTED SALPINGO OOPHORECTOMY (Right: Abdomen) OVARIAN CYSTECTOMY (Right) CYSTOSCOPY  Patient location during evaluation: PACU Anesthesia Type: General Level of consciousness: awake and alert Pain management: pain level controlled Vital Signs Assessment: post-procedure vital signs reviewed and stable Respiratory status: spontaneous breathing, nonlabored ventilation, respiratory function stable and patient connected to nasal cannula oxygen Cardiovascular status: blood pressure returned to baseline and stable Postop Assessment: no apparent nausea or vomiting Anesthetic complications: no   No notable events documented.   Last Vitals:  Vitals:   02/13/22 1645 02/13/22 1659  BP: (!) 143/90 (!) 145/90  Pulse: (!) 101 97  Resp: 11 18  Temp: 37.1 C (!) 36.4 C  SpO2: 96% 98%    Last Pain:  Vitals:   02/13/22 1659  TempSrc: Temporal  PainSc: 2                  Arita Miss

## 2022-02-13 NOTE — ED Notes (Signed)
Patient report pain has returned and is worse.  MD notified see orders.

## 2022-02-13 NOTE — ED Notes (Signed)
Husband at bedside at this time.

## 2022-02-13 NOTE — ED Notes (Signed)
Patient stated she could not find her license card this writer asked registration whom she gave it to and they stated [facility] took it for registering the patient and they gave it back to the patient. Patient and this writer looked through patients bag, no license was found. Patient stated "okay will look out for it next RN aware"

## 2022-02-13 NOTE — Anesthesia Preprocedure Evaluation (Signed)
Anesthesia Evaluation  Patient identified by MRN, date of birth, ID band Patient awake    Reviewed: Allergy & Precautions, NPO status , Patient's Chart, lab work & pertinent test results  History of Anesthesia Complications (+) PONV and history of anesthetic complications  Airway Mallampati: IV   Neck ROM: Full    Dental no notable dental hx.    Pulmonary neg pulmonary ROS,    Pulmonary exam normal breath sounds clear to auscultation       Cardiovascular Exercise Tolerance: Good negative cardio ROS Normal cardiovascular exam Rhythm:Regular Rate:Normal     Neuro/Psych negative neurological ROS     GI/Hepatic negative GI ROS,   Endo/Other  Hypothyroidism Obesity   Renal/GU negative Renal ROS     Musculoskeletal   Abdominal   Peds  Hematology negative hematology ROS (+)   Anesthesia Other Findings   Reproductive/Obstetrics                             Anesthesia Physical Anesthesia Plan  ASA: 2 and emergent  Anesthesia Plan: General   Post-op Pain Management:    Induction: Intravenous  PONV Risk Score and Plan: 4 or greater and Ondansetron, Dexamethasone, Treatment may vary due to age or medical condition and Scopolamine patch - Pre-op  Airway Management Planned: Oral ETT  Additional Equipment:   Intra-op Plan:   Post-operative Plan: Extubation in OR  Informed Consent: I have reviewed the patients History and Physical, chart, labs and discussed the procedure including the risks, benefits and alternatives for the proposed anesthesia with the patient or authorized representative who has indicated his/her understanding and acceptance.     Dental advisory given  Plan Discussed with: CRNA  Anesthesia Plan Comments: (Patient consented for risks of anesthesia including but not limited to:  - adverse reactions to medications - damage to eyes, teeth, lips or other oral mucosa -  nerve damage due to positioning  - sore throat or hoarseness - damage to heart, brain, nerves, lungs, other parts of body or loss of life  Informed patient about role of CRNA in peri- and intra-operative care.  Patient voiced understanding.)        Anesthesia Quick Evaluation

## 2022-02-13 NOTE — ED Notes (Signed)
OBGYN at bedside at this time.

## 2022-02-13 NOTE — H&P (Signed)
GYNECOLOGY HISTORY AND PHYSICAL NOTE  GYN HISTORY AND PHYSICAL  Attending Provider: Blake Divine, MD   Toniette Devera Deleon [id number] 02/13/2022 11:24 AM    Chief Complaint:   Brandi Deleon is a 51 y.o. G0P0000 premenopausal female seen at the request of Dr. Charna Archer for evaluation of right lower quadrant pain.    History of Present Ilness:   The patient was in her usual state of health until about 10 PM last night and noticed the acute-onset of pain in her right lower quadrant. She rates the pain as 10/10. She thought she was having bad gas at first. She  could do nothing to make the pain better. She describes the pain as stabbing.  The pain radiates across her abdomen and up her right side somewhat.  She notes nothing, apart from IV pain medication, that alleviates the pain.  No aggravating factors are noted apart from certain positions. Associated symptoms: nausea and vomiting. She denies fevers, chills, diarrhea, constipation, urinary symptoms, and other symptoms.  She states that initially the pain came in waves and now seems more constant.   Past Medical History:  Diagnosis Date   Hypothyroid    Past Surgical History:  Procedure Laterality Date   LUMBAR FUSION     No Known Allergies  Prior to Admission medications   Synthroid 88 mcg PO daily Wellbutrin 150 mg po daily MVI daily   Obstetric History: She is a G0P0000 female .    Social History:  She  reports that she has never smoked. She has never used smokeless tobacco. She reports current alcohol use. She reports current drug use. Drug: Marijuana.  Family History:  family history includes Cancer - Lung in her father; Ovarian cysts in her sister.   Review of Systems  Constitutional: Negative.  Negative for chills and fever.  HENT: Negative.    Eyes: Negative.   Respiratory: Negative.    Cardiovascular: Negative.   Gastrointestinal:  Positive for abdominal pain (see HPI), nausea and vomiting. Negative for  blood in stool, constipation, diarrhea and melena.  Genitourinary: Negative.  Negative for dysuria and urgency.  Musculoskeletal: Negative.  Negative for joint pain.  Skin: Negative.   Neurological: Negative.   Psychiatric/Behavioral: Negative.       Objective    BP (!) 150/87   Pulse 94   Temp 98.5 F (36.9 C) (Oral)   Resp 18   Ht 6' (1.829 m)   Wt 108.9 kg   LMP 01/09/2022 (Approximate)   SpO2 97%   BMI 32.55 kg/m  Physical Exam  Physical Exam Constitutional:      General: She is in acute distress (appears quite uncomfortable, rolling around intermittently in the bed trying to get comfortable).     Appearance: Normal appearance. She is well-developed.  Genitourinary:     Genitourinary Comments: deferred  HENT:     Head: Normocephalic and atraumatic.  Eyes:     General: No scleral icterus.    Conjunctiva/sclera: Conjunctivae normal.  Cardiovascular:     Rate and Rhythm: Normal rate and regular rhythm.     Heart sounds: No murmur heard.   No friction rub. No gallop.  Pulmonary:     Effort: Pulmonary effort is normal. No respiratory distress.     Breath sounds: Normal breath sounds. No wheezing or rales.  Abdominal:     General: Bowel sounds are normal. There is no distension.     Palpations: Abdomen is soft. There is no mass.  Tenderness: There is abdominal tenderness (RLQ and mid abdomen). There is guarding (voluntary). There is no right CVA tenderness, left CVA tenderness or rebound.  Musculoskeletal:        General: Normal range of motion.     Cervical back: Normal range of motion and neck supple.  Neurological:     General: No focal deficit present.     Mental Status: She is alert and oriented to person, place, and time.     Cranial Nerves: No cranial nerve deficit.  Skin:    General: Skin is warm and dry.     Findings: No erythema.  Psychiatric:        Mood and Affect: Mood normal.        Behavior: Behavior normal.        Judgment: Judgment normal.      Laboratory Results:   Lab Results  Component Value Date   WBC 8.5 02/13/2022   RBC 5.52 (H) 02/13/2022   HGB 15.8 (H) 02/13/2022   HCT 47.1 (H) 02/13/2022   PLT 278 02/13/2022   NA 137 02/13/2022   K 4.0 02/13/2022   CREATININE 0.79 02/13/2022   No results found for: PREGTESTUR, PREGSERUM, HCG, HCGQUANT  Imaging Results:  CT Abdomen Pelvis W Contrast  Result Date: 02/13/2022 CLINICAL DATA:  51 year old female with history of right lower quadrant abdominal pain. EXAM: CT ABDOMEN AND PELVIS WITH CONTRAST TECHNIQUE: Multidetector CT imaging of the abdomen and pelvis was performed using the standard protocol following bolus administration of intravenous contrast. RADIATION DOSE REDUCTION: This exam was performed according to the departmental dose-optimization program which includes automated exposure control, adjustment of the mA and/or kV according to patient size and/or use of iterative reconstruction technique. CONTRAST:  165m OMNIPAQUE IOHEXOL 300 MG/ML  SOLN COMPARISON:  None Available. FINDINGS: Lower chest: No acute abnormality. Hepatobiliary: No focal liver abnormality is seen. No gallstones, gallbladder wall thickening, or biliary dilatation. Pancreas: Unremarkable. No pancreatic ductal dilatation or surrounding inflammatory changes. Spleen: Normal in size without focal abnormality. Adrenals/Urinary Tract: Adrenal glands are unremarkable. Kidneys are normal, without renal calculi, focal lesion, or hydronephrosis. Bladder is unremarkable. Stomach/Bowel: Stomach is within normal limits. Appendix appears normal. Mild scattered descending and sigmoid colonic diverticula without surrounding inflammatory changes. No evidence of bowel wall thickening, distention, or inflammatory changes. Vascular/Lymphatic: No significant vascular findings are present. No enlarged abdominal or pelvic lymph nodes. Reproductive: Complex appearing right adnexal cyst measuring up to approximately 4.6 x 4.6 x 7.5  cm (AP by trans by cc) with mild surrounding fat stranding. Bilobed left simple appearing adnexal cysts measuring 4.9 x 4.5 x 5.1 cm posteriorly and 6.8 x 7.1 x 7.0 cm anteriorly. No significant pelvic ascites. The uterus is present unremarkable. Other: No abdominal wall hernia or abnormality. No abdominopelvic ascites. Musculoskeletal: Postsurgical changes after L4-S1 posterior spinal instrumented fusion without complicating features. No acute osseous abnormality. IMPRESSION: 1. Enlarged (measuring up to 7.5 cm), complex cystic appearance of the right adnexa with mild surrounding fat stranding. Given clinical presentation, these findings are concerning for ovarian torsion. Recommend gynecologic consultation and pelvic ultrasound for further characterization. 2. Bilobed versus 2 adjacent left adnexal cysts measuring up to 5.1 and 7.0 cm. Recommend attention on pelvic ultrasound as in recommended impression point. 3. Diverticulosis, no evidence of diverticulitis. These results were called by telephone at the time of interpretation on 02/13/2022 at 9:24 am to provider [facility], who verbally acknowledged these results. DRuthann Cancer MD [facility]  Electronically Signed   By: Ruthann Cancer M.D.   On: 02/13/2022 09:26      Assessment & Plan   Chery Giusto Deleon is a 51 y.o. G0P0000 premenopausal female with bilateral ovarian cysts and severe right lower quadrant pain. There is concern for ovarian torsion of the right side. The bilateral nature of the cysts and CT appearance of the cyst is concerning for torsion. There is some suspicion for malignancy. The plan is to alleviate the torsion and remove the right ovary and fallopian tube. If there is obvious malignancy, will possibly remove left fallopian tube and ovary, though this would not be the optimal surgery for her from a treatment and staging standpoint. However, since it is unknown whether this  is a malignancy or not, will proceed to treat her pain and biopsy, by removing, the right ovary.  She is in agreement with the surgery, as indicated above. I have personally consented her for the surgery. Will proceed emergently.    The risks of surgery were discussed in detail with the patient including but not limited to: bleeding which may require transfusion or reoperation; infection which may require antibiotics; injury to surrounding organs which may involve bowel, bladder, ureters ; need for additional procedures including laparoscopy or laparotomy; thromboembolic phenomenon, surgical site problems and other postoperative/anesthesia complications. Likelihood of success in alleviating the patient's condition was discussed. Routine postoperative instructions will be reviewed with the patient and her family in detail after surgery.  The patient concurred with the proposed plan, giving informed written consent for the surgery.  Anesthesia and OR aware.  Preoperative prophylactic antibiotics, as indicated, and SCDs ordered on call to the OR.  To OR when ready.  Prentice Docker, MD 02/13/2022 11:24 AM

## 2022-02-13 NOTE — Transfer of Care (Signed)
Immediate Anesthesia Transfer of Care Note  Patient: Shonte Beutler Lyons-Wheatly  Procedure(s) Performed: ROBOTIC ASSISTED SALPINGO OOPHORECTOMY (Right: Abdomen) OVARIAN CYSTECTOMY (Right) CYSTOSCOPY  Patient Location: PACU  Anesthesia Type:General  Level of Consciousness: drowsy and patient cooperative  Airway & Oxygen Therapy: Patient Spontanous Breathing and Patient connected to face mask oxygen  Post-op Assessment: Report given to RN and Post -op Vital signs reviewed and stable  Post vital signs: Reviewed and stable  Last Vitals:  Vitals Value Taken Time  BP 144/109 02/13/22 1554  Temp 36.7 C 02/13/22 1554  Pulse 96 02/13/22 1559  Resp 18 02/13/22 1559  SpO2 99 % 02/13/22 1559  Vitals shown include unvalidated device data.  Last Pain:  Vitals:   02/13/22 1136  TempSrc: Temporal  PainSc: 10-Worst pain ever         Complications: No notable events documented.

## 2022-02-13 NOTE — ED Notes (Addendum)
Dr. Jessup, MD at bedside at this time.  

## 2022-02-13 NOTE — Anesthesia Procedure Notes (Signed)
Procedure Name: Intubation Date/Time: 02/13/2022 12:32 PM Performed by: Lia Foyer, CRNA Pre-anesthesia Checklist: Patient identified, Emergency Drugs available, Suction available and Patient being monitored Patient Re-evaluated:Patient Re-evaluated prior to induction Oxygen Delivery Method: Circle system utilized Preoxygenation: Pre-oxygenation with 100% oxygen Induction Type: IV induction Ventilation: Mask ventilation without difficulty Laryngoscope Size: McGraph and 3 Grade View: Grade I Tube type: Oral Tube size: 7.0 mm Number of attempts: 1 Airway Equipment and Method: Stylet and Video-laryngoscopy Placement Confirmation: ETT inserted through vocal cords under direct vision, positive ETCO2 and breath sounds checked- equal and bilateral Secured at: 22 cm Tube secured with: Tape Dental Injury: Teeth and Oropharynx as per pre-operative assessment

## 2022-02-13 NOTE — Op Note (Signed)
Operative Note    Name: Shanyn Preisler Lyons-Wheatly  Date of Service: 02/13/2022  DOB: 09-04-1971  MRN: [id number]   Pre-Operative Diagnosis:  1) Right lower quadrant pain 2) Right ovarian torsion 3) bilateral ovarian cysts  Post-Operative Diagnosis:  1) Right lower quadrant pain 2) Right ovarian torsion 3) bilateral ovarian cysts  Procedures:  1)  Robot assisted laparoscopic right salpingo-oophorectomy 2) Cystoscopy  Primary Surgeon: Prentice Docker, MD   EBL: 50 mL   IVF: 1,300 mL   Urine output: 500 mL  Specimens:  1) Right fallopian tube and ovary 2) pelvic washings   Drains: none  Complications: None   Disposition: PACU   Condition: Stable   Findings:  1) enlarged, multilobular right ovary with evidence of torsion about the vascular pedicle.  The ovary with blue-black in appearance with apparent edema in the fallopian tube and ovary 2) left ovarian cyst that was in the deep pelvis. This appears to be more well-defined in shape.  No evidence of torsion 3) normal-appearing bladder on cystoscopy with efflux of urine from the bilateral ureteral orifices  Procedure Summary:  The patient was taken to the operating room where general anesthesia was administered and found to be adequate. She was placed in the dorsal supine lithotomy position in [place] stirrups and prepped and draped in usual sterile fashion. After a timeout was called an indwelling catheter was placed in her bladder.  The cervix was grasped with the single-tooth tenaculum and serially dilated. A ZUMI uterine manipulator was placed in accordance to the manufacturer's recommendations.  The single-tooth tenaculum was removed, as well as the speculum.   Attention was turned to the abdomen where after injection of local anesthetic, an 8 mm infraumbilical incision was made with the scalpel. Entry into the abdomen was obtained via Optiview trocar technique (a blunt entry technique with camera visualization through  the obturator upon entry). Verification of entry into the abdomen was obtained using opening pressures. The abdomen was insufflated with CO2. The camera was introduced through the trocar with verification of atraumatic entry.  Right and left abdominal entry sites were created after injection of local anesthetic about 8 cm away from the umbilical port in accordance with the Intuitive manufacturer's recommendations.  The port sites were 8 mm.  The intuitive trochars were introduced under intra-abdominal camera visualization without difficulty.   The XI robot was docked on the patient's left.  Clearance was verified from the patient's legs.  Through the umbilical port the camera was placed.  Through the port attached to arm 4 the forced bipolar forceps were placed.  The vessel sealer was attached to port 2.  An inspection of the abdomen and pelvis was taken with the above-noted findings.  The right fallopian tube and ovary was untwisted from its axis.  The right ureter could not be definitively visualized, though a structure was noted in the correct anatomic location with regard to the bifurcation of the right common iliac artery. There was no peristalsis of this structure. So, the right round ligament was sealed and divided.  This was carried cephalad until the right infundibulopelvic ligament  was skeletonized and no ureter was visualized in the area of interest.  The vessel sealer was used to divide the right infundibulopelvic ligament  and transect it.  Starting from the right cornu the mesosalpinx and utero-ovarian ligament were cauterized and divided.  In this way the right fallopian tube and ovary (with the cyst included) were freed from attachments.  The vascular pedical was  hemostatic.  The 45m specimen retrieval bag was placed through the umbilical port after port-hopping the camera.  The robot was undocked at this point to facilitate removal of the specimen. The specimen was removed in pieces through  the umbilical opening.  After the specimen was removed the left ovary was verified to have no torsion.  Hemostasis was noted. Given the successful completion of the surgery, the abdominal portion was terminated.  All instruments were removed from the ports. The abdomen was emptied of CO2.  All cannulae were removed.  Prior to emptying the abdomen of CO2. The PMI was used to close the supraumbilical port site with a single, 0 vicryl stitch.  All skin incisions were closed using 4-0 monocryl and covered with surgical skin glue.    Cystoscopy was undertaken at this point due to my inability to see the right ureter.  I had believed I visualized it but could never see it peristalsing.  . The Foley catheter was removed and the 70 cystoscope was gently introduced through the urethra. The bladder survey was undertaken with efflux of urine from both orifices noted. There were no defects noted in the bladder wall. The cystoscope was removed and the Foley catheter was utilized to fully empty the bladder. The catheter was removed.  The patient tolerated the procedure well.  Sponge, lap, needle, and instrument counts were correct x 2.  VTE prophylaxis: SCDs. Antibiotic prophylaxis: none indicated and none given. She was awakened in the operating room and was taken to the PACU in stable condition.   SPrentice Docker MD 02/13/2022 3:44 PM

## 2022-02-13 NOTE — ED Notes (Signed)
Pt transported to CT at this time.

## 2022-02-13 NOTE — Discharge Instructions (Signed)

## 2022-02-14 ENCOUNTER — Encounter: Payer: Self-pay | Admitting: Obstetrics and Gynecology

## 2022-02-14 LAB — TYPE AND SCREEN
ABO/RH(D): A POS
Antibody Screen: NEGATIVE

## 2022-02-14 LAB — ABO/RH: ABO/RH(D): A POS

## 2022-02-15 LAB — OVARIAN MALIGNANCY RISK-ROMA
Cancer Antigen (CA) 125: 71.6 U/mL — ABNORMAL HIGH (ref 0.0–38.1)
HE4: 55.7 pmol/L (ref 0.0–105.2)
Postmenopausal ROMA: 3.14 — ABNORMAL HIGH
Premenopausal ROMA: 1.03

## 2022-02-15 LAB — URINE CULTURE: Culture: NO GROWTH

## 2022-02-15 LAB — POSTMENOPAUSAL INTERP: HIGH

## 2022-02-15 LAB — PREMENOPAUSAL INTERP: LOW

## 2022-02-16 LAB — SURGICAL PATHOLOGY

## 2022-02-16 LAB — CYTOLOGY - NON PAP

## 2022-02-28 IMAGING — US US THYROID
1 series · 13 of 25 positions shown · non-contrast
Comparison: None.

CLINICAL DATA: Nontoxic goiter.

EXAM:
THYROID ULTRASOUND
TECHNIQUE: Ultrasound examination of the thyroid gland and adjacent soft
tissues was performed.

[Series 1: us thyroid · 0.09mm/px · 52 acquisitions, 13 frames shown]
[im 1/52]
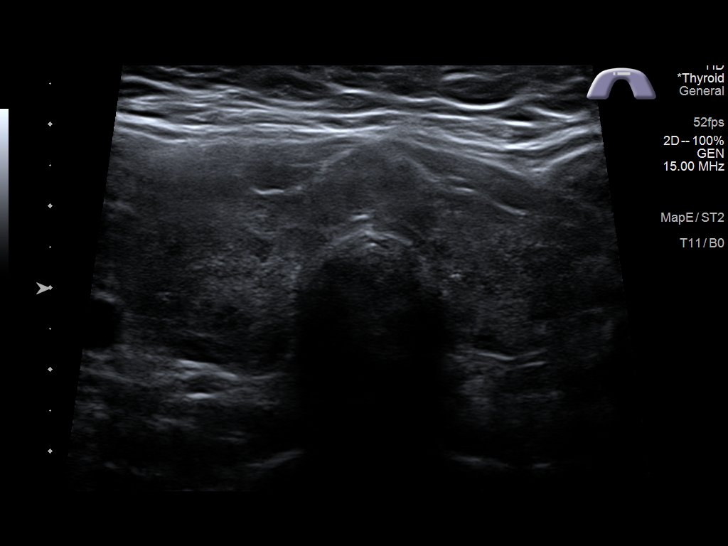
[im 5/52]
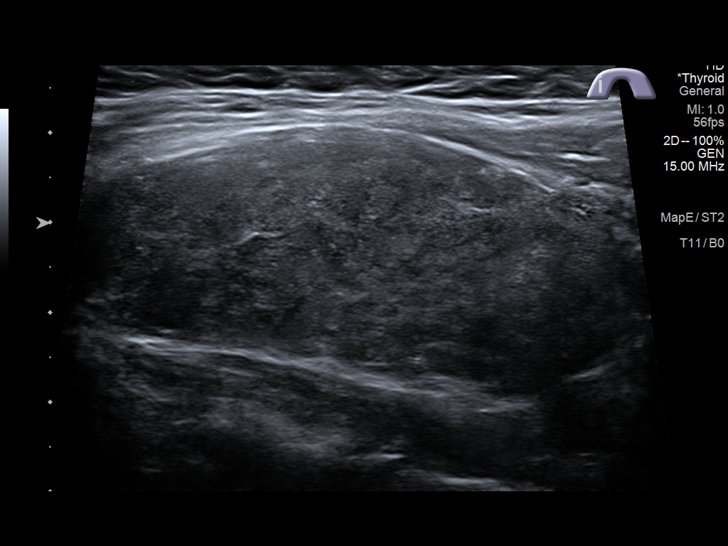
[im 9/52]
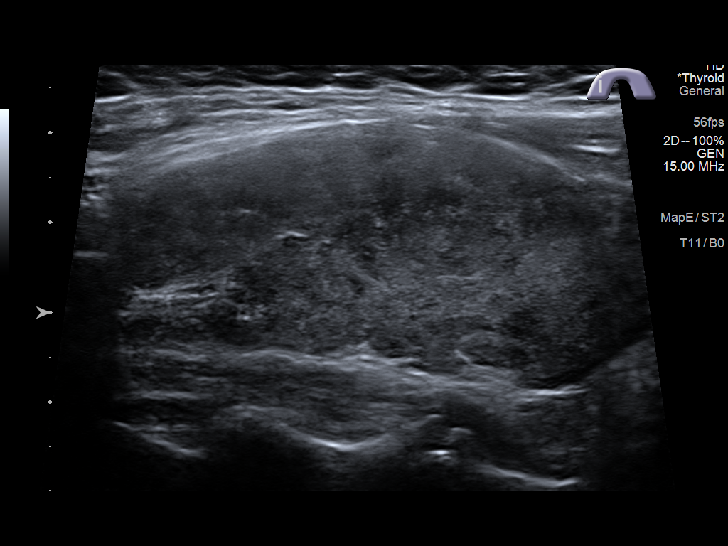
[im 13/52]
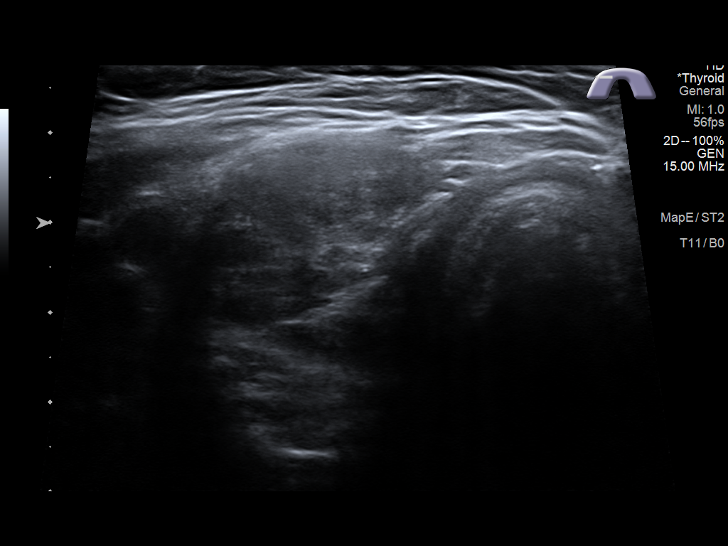
[im 18/52]
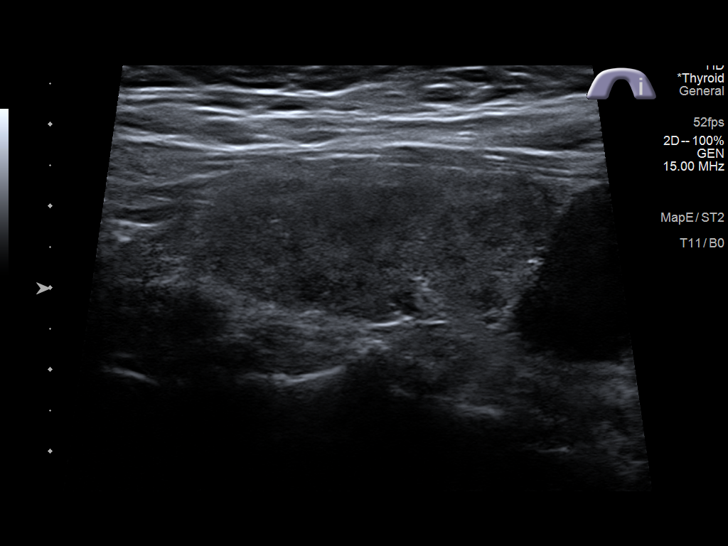
[im 22/52]
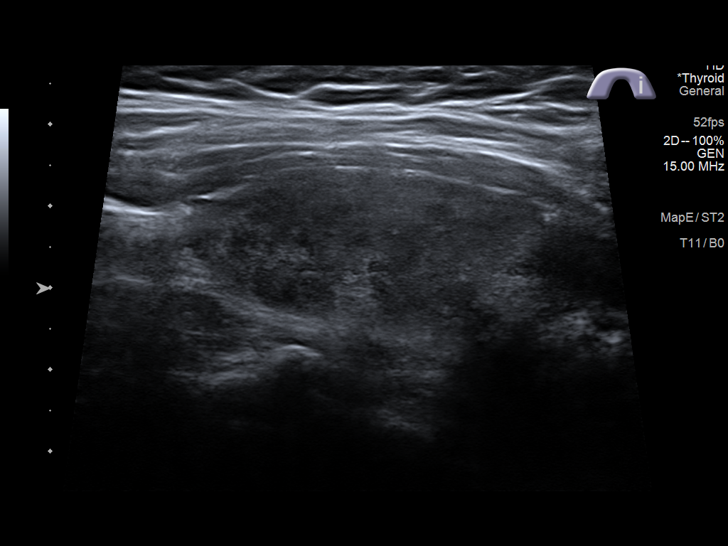
[im 26/52]
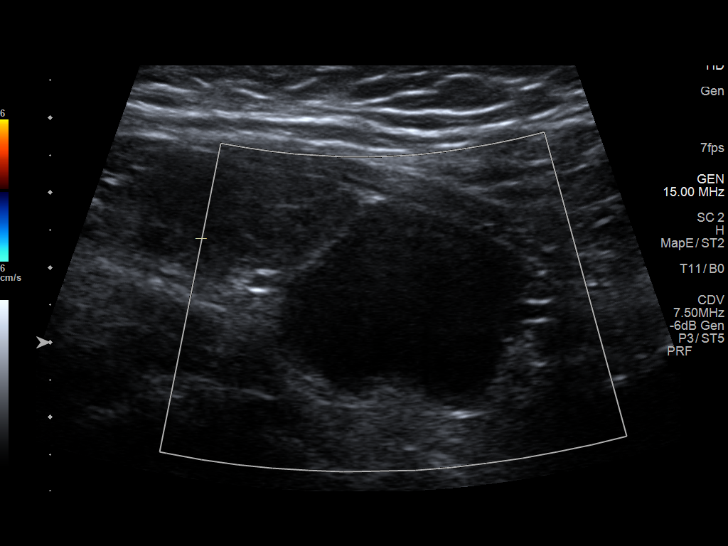
[im 30/52]
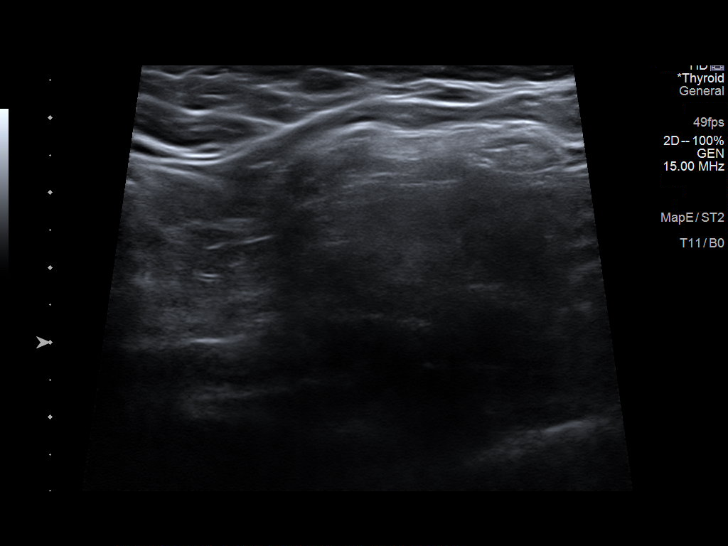
[im 35/52]
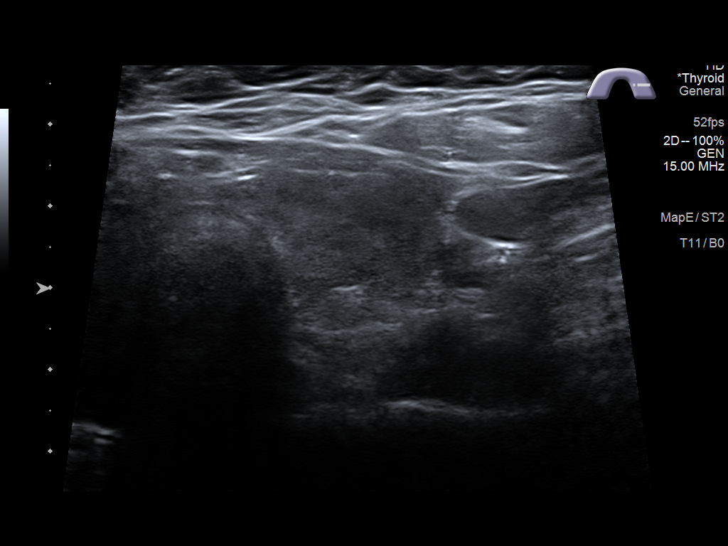
[im 39/52]
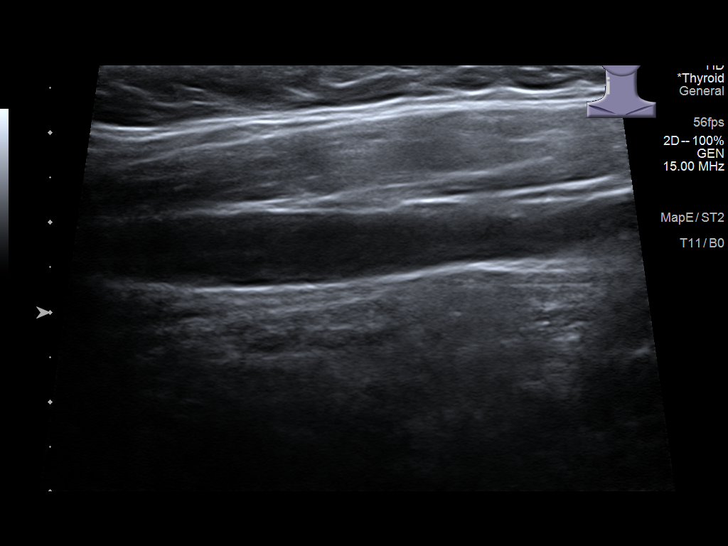
[im 43/52]
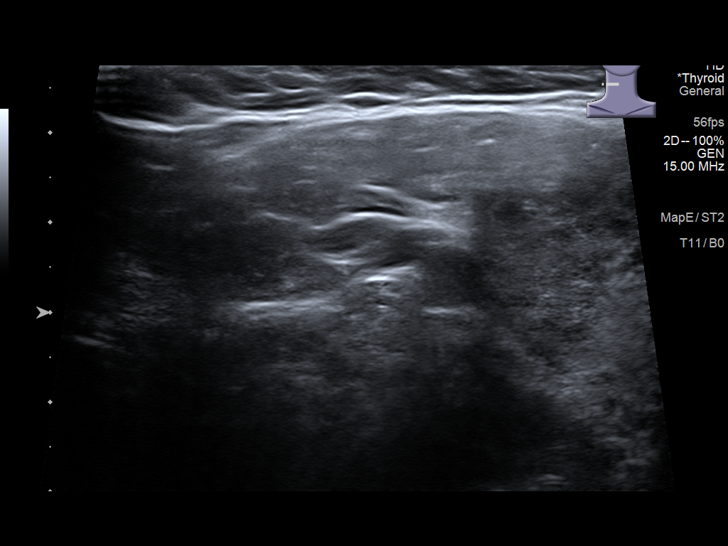
[im 47/52]
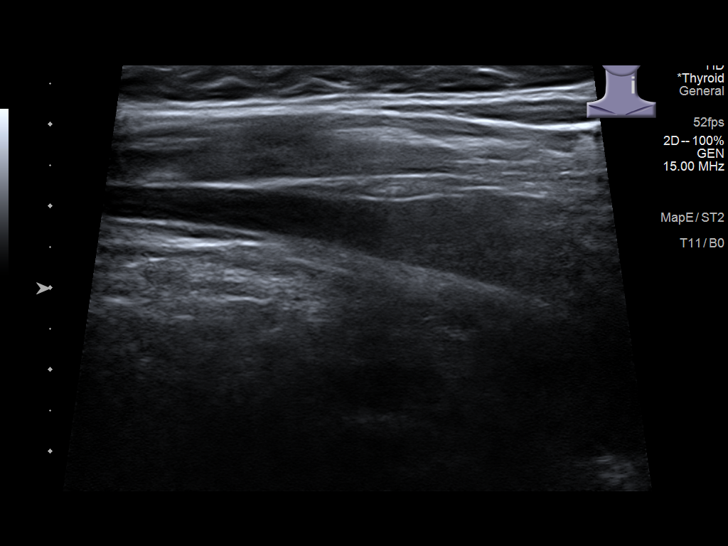
[im 52/52]
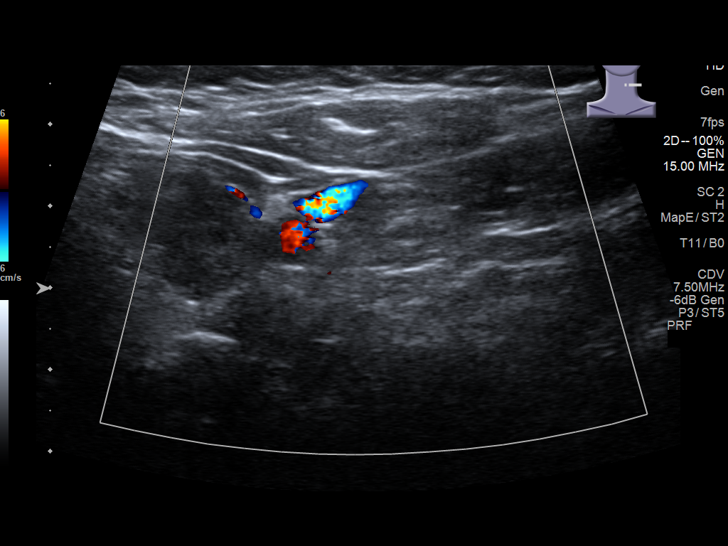

[13 of 25 positions shown; findings below may reference images not displayed]

FINDINGS: Parenchymal Echotexture: Moderately heterogenous

Isthmus: 1 cm

Right lobe: 6.7 x 2.6 x 2.8 cm

Left lobe: 4.6 x 2.1 x 2.5 cm

_________________________________________________________

Estimated total number of nodules >/= 1 cm: 0

Number of spongiform nodules >/=  2 cm not described below (TR1): 0

Number of mixed cystic and solid nodules >/= 1.5 cm not described
below (TR2): 0

_________________________________________________________

Inferior to the left thyroid gland there is a cystic mass measuring
3.5 x 2.6 x 3.2 cm. This may be separate from the thyroid gland but
is difficult to fully evaluate secondary to its inferior location.
IMPRESSION: 1. Moderately heterogeneous, significantly enlarged thyroid gland as
detailed above.
2. There is a 3.5 cm cystic mass inferior to the left thyroid gland.
This may be separate from the thyroid gland or represent a
pedunculated cystic thyroid nodule. If this is associated with the
thyroid gland, it does not meet criteria for FNA or biopsy.

The above is in keeping with the ACR TI-RADS recommendations - [HOSPITAL] 3291;[DATE].

## 2022-03-02 ENCOUNTER — Telehealth: Payer: Self-pay

## 2022-03-02 NOTE — Telephone Encounter (Signed)
Referral received from Dr. Glennon Mac. Called and left voicemail to return call for scheduling.

## 2022-03-15 ENCOUNTER — Inpatient Hospital Stay: Payer: Commercial Managed Care - PPO | Attending: Obstetrics and Gynecology | Admitting: Obstetrics and Gynecology

## 2022-03-15 DIAGNOSIS — Z79899 Other long term (current) drug therapy: Secondary | ICD-10-CM | POA: Insufficient documentation

## 2022-03-15 DIAGNOSIS — Z79890 Hormone replacement therapy (postmenopausal): Secondary | ICD-10-CM | POA: Diagnosis not present

## 2022-03-15 DIAGNOSIS — Z6833 Body mass index (BMI) 33.0-33.9, adult: Secondary | ICD-10-CM | POA: Insufficient documentation

## 2022-03-15 DIAGNOSIS — D270 Benign neoplasm of right ovary: Secondary | ICD-10-CM | POA: Insufficient documentation

## 2022-03-15 DIAGNOSIS — Z90721 Acquired absence of ovaries, unilateral: Secondary | ICD-10-CM | POA: Insufficient documentation

## 2022-03-15 DIAGNOSIS — E039 Hypothyroidism, unspecified: Secondary | ICD-10-CM | POA: Insufficient documentation

## 2022-03-15 NOTE — Progress Notes (Signed)
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 G73 female who is seen in consultation from Dr. 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  Found to have bilateral ovarian cystic masses. Right 7.5 cm and had emergent surgery for torsion.      CT scan FINDINGS: Lower chest: No acute abnormality.   Hepatobiliary: No focal liver abnormality is seen. No gallstones, gallbladder wall thickening, or biliary dilatation.   Pancreas: Unremarkable. No pancreatic ductal dilatation or surrounding inflammatory changes.   Spleen: Normal in size without focal abnormality.   Adrenals/Urinary Tract: Adrenal glands are unremarkable. Kidneys are normal, without renal calculi, focal lesion, or hydronephrosis. Bladder is unremarkable.   Stomach/Bowel: Stomach is within normal limits. Appendix appears normal. Mild scattered descending and sigmoid colonic diverticula without surrounding inflammatory changes. No evidence of bowel wall thickening, distention, or inflammatory changes.   Vascular/Lymphatic: No significant vascular findings are present. No enlarged abdominal or pelvic lymph nodes.   Reproductive: Complex appearing right adnexal cyst measuring up to approximately 4.6 x 4.6 x 7.5 cm (AP by trans by cc) with mild surrounding fat stranding. Bilobed left simple appearing adnexal cysts measuring 4.9 x 4.5 x 5.1 cm posteriorly and 6.8 x 7.1 x 7.0 cm anteriorly. No significant pelvic ascites. The uterus is present unremarkable.   Other: No abdominal wall hernia or abnormality. No abdominopelvic ascites.   Musculoskeletal: Postsurgical changes after L4-S1 posterior spinal instrumented fusion without complicating features. No acute osseous abnormality.   IMPRESSION: 1.  Enlarged (measuring up to 7.5 cm), complex cystic appearance of the right adnexa with mild surrounding fat stranding. Given clinical presentation, these findings are concerning for ovarian torsion. Recommend gynecologic consultation and pelvic ultrasound for further characterization. 2. Bilobed versus 2 adjacent left adnexal cysts measuring up to 5.1 and 7.0 cm. Recommend attention on pelvic ultrasound as in recommended impression point. 3. Diverticulosis, no evidence of diverticulitis.  Underwent Robotic RSO 02/13/22 for torsion. Findings:  1) enlarged, multilobular right ovary with evidence of torsion about the vascular pedicle.  The ovary with blue-black in appearance with apparent edema in the fallopian tube and ovary 2) left ovarian cyst that was in the deep pelvis. This appears to be more well-defined in shape.  No evidence of torsion   Pathology A.  OVARY AND FALLOPIAN TUBE, RIGHT; SALPINGO-OOPHORECTOMY:  - OVARIAN SEROUS CYST ADENOFIBROMA WITH FOCAL EPITHELIAL PROLIFERATION,  SEE COMMENT.  - FALLOPIAN TUBE WITH BENIGN PARATUBAL CYST.  - HEMORRHAGIC CORPUS LUTEUM.  - NEGATIVE FOR MALIGNANCY.   Comment:  Sections demonstrate an ovarian serous cyst adenofibroma with focal  areas with serous borderline tumor morphology. Stromal invasion is not  identified. By gross examination, the tissue with serous borderline  tumor morphology involves less than 10% of the ovarian mass. As per  current WHO classification criteria, this lesion is consistent with a  serous cyst adenofibroma with focal epithelial proliferation.   Washings negative.   Used OCPs age [age].  No contraception since then and has never gotten pregnant.  Still has regular periods, no DUB.      Problem List: Patient Active Problem List   Diagnosis Date Noted   Bilateral ovarian cysts 02/13/2022   Ovarian torsion 02/13/2022    Past Medical History: Past Medical History:  Diagnosis Date   Hypothyroid     Past  Surgical History: Past Surgical History:  Procedure Laterality Date   CYSTOSCOPY  02/13/2022   Procedure: CYSTOSCOPY;  Surgeon: Will Bonnet, MD;  Location: [facility] ORS;  Service: Gynecology;;   LUMBAR FUSION     OVARIAN CYST REMOVAL Right 02/13/2022   Procedure: OVARIAN CYSTECTOMY;  Surgeon: Will Bonnet, MD;  Location: [facility] OR[facility];  Service: Gynecology;  Laterality: Right;   ROBOTIC ASSISTED SALPINGO OOPHERECTOMY Right 02/13/2022   Procedure: ROBOTIC ASSISTED SALPINGO OOPHORECTOMY;  Surgeon: Will Bonnet, MD;  Location: [facility] ORS;  Service: Gynecology;  Laterality: Right;       OB History:  OB History  Gravida Para Term Preterm AB Living  0 0 0 0 0 0  SAB IAB Ectopic Multiple Live Births  0 0 0 0 0    Family History: Family History  Problem Relation Age of Onset   Cancer - Lung Father    Ovarian cysts Sister    Breast cancer Neg Hx     Social History: Social History   Socioeconomic History   Marital status: Widowed    Spouse name: Not on file   Number of children: Not on file   Years of education: Not on file   Highest education level: Not on file  Occupational History   Not on file  Tobacco Use   Smoking status: Never   Smokeless tobacco: Never  Substance and Sexual Activity   Alcohol use: Yes    Comment: 1 beer weekly   Drug use: Yes    Types: Marijuana    Comment: rare   Sexual activity: Yes    Partners: Male  Other Topics Concern   Not on file  Social History Narrative   Not on file   Social Determinants of Health   Financial Resource Strain: Not on file  Food Insecurity: Not on file  Transportation Needs: Not on file  Physical Activity: Not on file  Stress: Not on file  Social Connections: Not on file  Intimate Partner Violence: Not on file    Allergies: No Known Allergies  Current Medications: Current Outpatient Medications  Medication Sig Dispense Refill   buPROPion (WELLBUTRIN XL) 150 MG 24 hr tablet Take 150 mg by mouth daily.      ibuprofen (ADVIL) 600 MG tablet Take 1 tablet (600 mg total) by mouth every 6 (six) hours. 30 tablet 0   levothyroxine (SYNTHROID) 88 MCG tablet Take 88 mcg by mouth daily before breakfast.     Multiple Vitamin (MULTIVITAMIN) tablet Take 1 tablet by mouth daily.     ondansetron (ZOFRAN-ODT) 4 MG disintegrating tablet Take 1 tablet (4 mg total) by mouth every 6 (six) hours as needed for nausea. 20 tablet 0   No current facility-administered medications for this visit.    Review of Systems General: negative for, fevers, chills, fatigue, changes in sleep, changes in weight or appetite Skin: negative for changes in color, texture, moles or lesions Eyes: negative for, changes in vision, pain, diplopia HEENT: negative for, change in hearing, pain, discharge, tinnitus, vertigo, voice changes, sore throat, neck masses Breasts: negative for breast lumps Pulmonary: negative for, dyspnea, orthopnea, productive cough Cardiac: negative for, palpitations, syncope, pain, discomfort, pressure Gastrointestinal: negative for, dysphagia, nausea, vomiting, jaundice, pain, constipation, diarrhea, hematemesis, hematochezia Genitourinary/Sexual: negative for, dysuria, discharge, hesitancy, nocturia, retention, stones, infections, STD's, incontinence Musculoskeletal: negative for, pain, stiffness, swelling, range of motion limitation Hematology: negative for, easy bruising, bleeding Neurologic/Psych: negative for, headaches, seizures, paralysis, weakness, tremor, change in gait, change in sensation,  mood swings, depression, anxiety, change in memory  Objective:  Physical Examination:  BP (!) 117/99   Pulse (!) 105   Temp 98.7 F (37.1 C)   Resp 20   Wt 244 lb (110.7 kg)   LMP 01/09/2022 (Approximate) Comment: UPT neg DOS  SpO2 100%   BMI 33.09 kg/m    ECOG Performance Status: 1 - Symptomatic but completely ambulatory  General appearance: alert, cooperative, and appears stated age HEENT:neck supple  with midline trachea and thyroid without masses Lymph node survey: non-palpable, axillary, inguinal, supraclavicular Cardiovascular: regular rate and rhythm, no murmurs or gallops Respiratory: normal air entry, lungs clear to auscultation Abdomen: soft, non-tender, without masses or organomegaly, no hernias, and well healed incisions Back: inspection of back is normal Extremities: extremities normal, atraumatic, no cyanosis or edema Skin exam - normal coloration and turgor, no rashes, no suspicious skin lesions noted. Neurological exam reveals alert, oriented, normal speech, no focal findings or movement disorder noted.  Pelvic: exam chaperoned by nurse;  Vulva: normal appearing vulva with no masses, tenderness or lesions; Vagina: normal vagina; Adnexa: normal adnexa in size, nontender and no masses; Uterus: uterus is normal size, shape, consistency and nontender; Cervix: anteverted; Rectal: normal rectal, no masses    Assessment:  Cressie Betzler is a 51 y.o. G0 premenopausal female diagnosed with 7.5 cm right ovarian serous cystadenofibroma with 10% serous borderline component at emergency surgery for torsion 02/13/22.  Washings negative.  Large bilobed cyst up to 7 cm noted in left ovary on CT scan.  This is not palpable on exam, but not too surprising given body habitus.  Told her that the prognosis should be excellent, but that other adnexa should also be removed.     Medical co-morbidities complicating care: BMI 33..  Plan:   Problem List Items Addressed This Visit       Endocrine   Serous cystadenofibroma, right    In view of the serous borderline tumor in the right ovarian cystadenofibroma, we recommend removal of the other adnexa, since these tumors are often bilateral and fertility is not an issue. Omental and peritoneal biopsies and washings could also be done.  Removal of the uterus is optional and she would like to keep it unless an abnormality is seen at surgery.  She has  normal menses and acknowledges that she will be going through menopause soon.   Suggested that she see Dr Glennon Mac back for pre op for minimally invasive LSO, omental biopsy, peritoneal biopsies and possible hysterectomy.  The initial surgery was done using the robot and he may want to do this second surgery that way as well to use the same incisions.  In that case, Dr Theora Gianotti can assist him with the case.    The patient's diagnosis, an outline of the further diagnostic and laboratory studies which will be required, the recommendation, and alternatives were discussed.  All questions were answered to the patient's satisfaction.  A total of 60 minutes were spent with the patient/family today; 50% was spent in education, counseling and coordination of care for serous cystadenofibroma with borderline tumor.    Mellody Drown, MD  CC:  Kirk Ruths, MD [place] [facility] [place],  [place] [address] [phone]

## 2022-03-31 ENCOUNTER — Other Ambulatory Visit: Payer: Self-pay | Admitting: Obstetrics and Gynecology
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
## 2022-04-10 ENCOUNTER — Other Ambulatory Visit: Payer: Self-pay | Admitting: Family

## 2022-04-10 DIAGNOSIS — Z1231 Encounter for screening mammogram for malignant neoplasm of breast: Secondary | ICD-10-CM

## 2022-04-17 ENCOUNTER — Encounter
Admission: RE | Admit: 2022-04-17 | Discharge: 2022-04-17 | Disposition: A | Discharge status: Home / Self Care | Payer: Commercial Managed Care - PPO | Source: Ambulatory Visit | Attending: Obstetrics and Gynecology | Admitting: Obstetrics and Gynecology

## 2022-04-17 ENCOUNTER — Encounter: Payer: Self-pay | Admitting: Obstetrics and Gynecology

## 2022-04-17 NOTE — Patient Instructions (Addendum)
Your procedure is scheduled on: Wednesday April 26, 2022. Report to Day Surgery inside [facility] 2nd floor, stop by admissions desk before getting on elevator.  To find out your arrival time please call [phone] between 1PM - 3PM on Tuesday April 25, 2022.  Remember: Instructions that are not followed completely may result in serious medical risk,  up to and including death, or upon the discretion of your surgeon and anesthesiologist your  surgery may need to be rescheduled.     _X__ 1. Do not eat food or drink fluids after midnight the night before your procedure.                 No chewing gum or hard candies.   __X__2.  On the morning of surgery brush your teeth with toothpaste and water, you                may rinse your mouth with mouthwash if you wish.  Do not swallow any toothpaste or mouthwash.     _X__ 3.  No Alcohol for 24 hours before or after surgery.   _X__ 4.  Do Not Smoke or use e-cigarettes For 24 Hours Prior to Your Surgery.                 Do not use any chewable tobacco products for at least 6 hours prior to                 Surgery.  _X__  5.  Do not use any recreational drugs (marijuana, cocaine, heroin, ecstasy, MDMA or other)                For at least one week prior to your surgery.  Combination of these drugs with anesthesia                May have life threatening results.  ____  6.  Bring all medications with you on the day of surgery if instructed.   __X__  7.  Notify your doctor if there is any change in your medical condition      (cold, fever, infections).     Do not wear jewelry, make-up, hairpins, clips or nail polish. Do not wear lotions, powders, or perfumes. You may wear deodorant. Do not shave 48 hours prior to surgery. Men may shave face and neck. Do not bring valuables to the hospital.    [facility] is not responsible for any belongings or valuables.  Contacts, dentures or bridgework may not be worn into  surgery. Leave your suitcase in the car. After surgery it may be brought to your room. For patients admitted to the hospital, discharge time is determined by your treatment team.   Patients discharged the day of surgery will not be allowed to drive home.   Make arrangements for someone to be with you for the first 24 hours of your Same Day Discharge.   __X__ Take these medicines the morning of surgery with A SIP OF WATER:    1. buPROPion (WELLBUTRIN XL) 150 MG 24 hr tablet  2. levothyroxine (SYNTHROID) 88 MCG tablet  3.   4.  5.  6.  ____ Fleet Enema (as directed)   __X__ Use CHG Soap (or wipes) as directed  ____ Use Benzoyl Peroxide Gel as instructed  ____ Use inhalers on the day of surgery  ____ Stop metformin 2 days prior to surgery    ____ Take 1/2 of usual insulin dose the night before  surgery. No insulin the morning          of surgery.   ____ Call your PCP, cardiologist, or Pulmonologist if taking Coumadin/Plavix/aspirin and ask when to stop before your surgery.   __X_ One Week prior to surgery- Stop Anti-inflammatories such as Ibuprofen, Aleve, Advil, Motrin, meloxicam (MOBIC), diclofenac, etodolac, ketorolac, Toradol, Daypro, piroxicam, Goody's or BC powders. OK TO USE TYLENOL IF NEEDED   __X__ One week prior to surgery stop all supplements until after surgery.    ____ Bring C-Pap to the hospital.    If you have any questions regarding your pre-procedure instructions,  Please call Pre-admit Testing at [phone]

## 2022-04-18 ENCOUNTER — Encounter
Admission: RE | Admit: 2022-04-18 | Discharge: 2022-04-18 | Disposition: A | Discharge status: Home / Self Care | Payer: Commercial Managed Care - PPO | Source: Ambulatory Visit | Attending: Obstetrics and Gynecology | Admitting: Obstetrics and Gynecology

## 2022-04-18 DIAGNOSIS — Z01818 Encounter for other preprocedural examination: Secondary | ICD-10-CM | POA: Diagnosis present

## 2022-04-18 DIAGNOSIS — D3911 Neoplasm of uncertain behavior of right ovary: Secondary | ICD-10-CM

## 2022-04-18 LAB — COMPREHENSIVE METABOLIC PANEL
ALT: 22 U/L (ref 0–44)
AST: 23 U/L (ref 15–41)
Albumin: 4 g/dL (ref 3.5–5.0)
Alkaline Phosphatase: 52 U/L (ref 38–126)
Anion gap: 6 (ref 5–15)
BUN: 19 mg/dL (ref 6–20)
CO2: 26 mmol/L (ref 22–32)
Calcium: 9.2 mg/dL (ref 8.9–10.3)
Chloride: 110 mmol/L (ref 98–111)
Creatinine, Ser: 0.79 mg/dL (ref 0.44–1.00)
GFR, Estimated: >60 mL/min (ref >60)
Glucose, Bld: 90 mg/dL (ref 70–99)
Potassium: 4.4 mmol/L (ref 3.5–5.1)
Sodium: 142 mmol/L (ref 135–145)
Total Bilirubin: 0.8 mg/dL (ref 0.3–1.2)
Total Protein: 7.5 g/dL (ref 6.5–8.1)

## 2022-04-18 LAB — CBC
HCT: 44.6 % (ref 36.0–46.0)
Hemoglobin: 14.9 g/dL (ref 12.0–15.0)
MCH: 28.8 pg (ref 26.0–34.0)
MCHC: 33.4 g/dL (ref 30.0–36.0)
MCV: 86.1 fL (ref 80.0–100.0)
Platelets: 247 10*3/uL (ref 150–400)
RBC: 5.18 MIL/uL — ABNORMAL HIGH (ref 3.87–5.11)
RDW: 12.7 % (ref 11.5–15.5)
WBC: 4.9 10*3/uL (ref 4.0–10.5)
nRBC: 0 % (ref 0.0–0.2)

## 2022-04-18 LAB — TYPE AND SCREEN
ABO/RH(D): A POS
Antibody Screen: NEGATIVE

## 2022-04-25 MED ORDER — POVIDONE-IODINE 10 % EX SWAB: 2.0000 | Freq: Once | CUTANEOUS | Status: DC

## 2022-04-25 MED ORDER — ORAL CARE MOUTH RINSE: 15.0000 mL | Freq: Once | OROMUCOSAL | Status: AC

## 2022-04-25 MED ORDER — CHLORHEXIDINE GLUCONATE 0.12 % MT SOLN: 15.0000 mL | Freq: Once | OROMUCOSAL | Status: AC

## 2022-04-25 MED ORDER — LACTATED RINGERS IV SOLN: INTRAVENOUS | Status: DC

## 2022-04-25 MED ORDER — CEFAZOLIN SODIUM-DEXTROSE 2-4 GM/100ML-% IV SOLN
2.0000 g | INTRAVENOUS | Status: AC
  Administered 2022-04-26: 2 g via INTRAVENOUS

## 2022-04-25 MED ORDER — FAMOTIDINE 20 MG PO TABS: 20.0000 mg | ORAL_TABLET | Freq: Once | ORAL | Status: AC

## 2022-04-26 ENCOUNTER — Ambulatory Visit
Admission: RE | Admit: 2022-04-26 | Discharge: 2022-04-26 | Disposition: A | Discharge status: Home / Self Care | Payer: Commercial Managed Care - PPO | Source: Ambulatory Visit | Attending: Obstetrics and Gynecology | Admitting: Obstetrics and Gynecology

## 2022-04-26 ENCOUNTER — Ambulatory Visit: Payer: Commercial Managed Care - PPO | Admitting: Urgent Care

## 2022-04-26 ENCOUNTER — Encounter
Admission: RE | Disposition: A | Discharge status: Home / Self Care | Payer: Self-pay | Source: Ambulatory Visit | Attending: Obstetrics and Gynecology

## 2022-04-26 ENCOUNTER — Other Ambulatory Visit: Payer: Self-pay

## 2022-04-26 ENCOUNTER — Encounter: Payer: Self-pay | Admitting: Obstetrics and Gynecology

## 2022-04-26 ENCOUNTER — Ambulatory Visit: Payer: Commercial Managed Care - PPO | Admitting: Certified Registered Nurse Anesthetist

## 2022-04-26 DIAGNOSIS — D270 Benign neoplasm of right ovary: Secondary | ICD-10-CM | POA: Diagnosis not present

## 2022-04-26 DIAGNOSIS — Z01812 Encounter for preprocedural laboratory examination: Secondary | ICD-10-CM

## 2022-04-26 DIAGNOSIS — N840 Polyp of corpus uteri: Secondary | ICD-10-CM | POA: Diagnosis not present

## 2022-04-26 DIAGNOSIS — N8003 Adenomyosis of the uterus: Secondary | ICD-10-CM | POA: Insufficient documentation

## 2022-04-26 DIAGNOSIS — D3911 Neoplasm of uncertain behavior of right ovary: Secondary | ICD-10-CM

## 2022-04-26 DIAGNOSIS — E039 Hypothyroidism, unspecified: Secondary | ICD-10-CM | POA: Insufficient documentation

## 2022-04-26 DIAGNOSIS — N83202 Unspecified ovarian cyst, left side: Secondary | ICD-10-CM | POA: Diagnosis not present

## 2022-04-26 DIAGNOSIS — N83201 Unspecified ovarian cyst, right side: Secondary | ICD-10-CM | POA: Diagnosis present

## 2022-04-26 HISTORY — PX: CYSTOSCOPY: SHX5120

## 2022-04-26 HISTORY — PX: ROBOTIC ASSISTED LAPAROSCOPIC HYSTERECTOMY AND SALPINGECTOMY: SHX6379

## 2022-04-26 HISTORY — PX: ROBOTIC ASSISTED BILATERAL SALPINGO OOPHERECTOMY: SHX6078

## 2022-04-26 LAB — POCT PREGNANCY, URINE: Preg Test, Ur: NEGATIVE

## 2022-04-26 SURGERY — SALPINGO-OOPHORECTOMY, BILATERAL, ROBOT-ASSISTED
Anesthesia: General

## 2022-04-26 MED ORDER — MIDAZOLAM HCL 2 MG/2ML IJ SOLN
INTRAMUSCULAR | Status: AC
  Filled 2022-04-26: qty 2

## 2022-04-26 MED ORDER — ROCURONIUM BROMIDE 100 MG/10ML IV SOLN
INTRAVENOUS | Status: DC | PRN
  Administered 2022-04-26: 20 mg via INTRAVENOUS
  Administered 2022-04-26: 50 mg via INTRAVENOUS
  Administered 2022-04-26: 20 mg via INTRAVENOUS
  Administered 2022-04-26: 30 mg via INTRAVENOUS

## 2022-04-26 MED ORDER — PROPOFOL 500 MG/50ML IV EMUL
INTRAVENOUS | Status: DC | PRN
  Administered 2022-04-26: 150 ug/kg/min via INTRAVENOUS

## 2022-04-26 MED ORDER — PROPOFOL 1000 MG/100ML IV EMUL
INTRAVENOUS | Status: AC
  Filled 2022-04-26: qty 100

## 2022-04-26 MED ORDER — MIDAZOLAM HCL 2 MG/2ML IJ SOLN
INTRAMUSCULAR | Status: DC | PRN
  Administered 2022-04-26: 2 mg via INTRAVENOUS

## 2022-04-26 MED ORDER — FENTANYL CITRATE (PF) 100 MCG/2ML IJ SOLN
INTRAMUSCULAR | Status: AC
  Filled 2022-04-26: qty 2

## 2022-04-26 MED ORDER — 0.9 % SODIUM CHLORIDE (POUR BTL) OPTIME
TOPICAL | Status: DC | PRN
  Administered 2022-04-26: 150 mL

## 2022-04-26 MED ORDER — ONDANSETRON HCL 4 MG/2ML IJ SOLN
INTRAMUSCULAR | Status: DC | PRN
  Administered 2022-04-26: 4 mg via INTRAVENOUS

## 2022-04-26 MED ORDER — OXYCODONE HCL 5 MG PO TABS
ORAL_TABLET | ORAL | Status: AC
  Filled 2022-04-26: qty 1

## 2022-04-26 MED ORDER — PHENYLEPHRINE 80 MCG/ML (10ML) SYRINGE FOR IV PUSH (FOR BLOOD PRESSURE SUPPORT)
PREFILLED_SYRINGE | INTRAVENOUS | Status: DC | PRN
  Administered 2022-04-26: 80 ug via INTRAVENOUS
  Administered 2022-04-26 (×2): 160 ug via INTRAVENOUS
  Administered 2022-04-26: 80 ug via INTRAVENOUS

## 2022-04-26 MED ORDER — KETOROLAC TROMETHAMINE 30 MG/ML IJ SOLN
INTRAMUSCULAR | Status: DC | PRN
  Administered 2022-04-26: 30 mg via INTRAVENOUS

## 2022-04-26 MED ORDER — ROCURONIUM BROMIDE 10 MG/ML (PF) SYRINGE
PREFILLED_SYRINGE | INTRAVENOUS | Status: AC
  Filled 2022-04-26: qty 10

## 2022-04-26 MED ORDER — DEXAMETHASONE SODIUM PHOSPHATE 10 MG/ML IJ SOLN
INTRAMUSCULAR | Status: DC | PRN
  Administered 2022-04-26: 10 mg via INTRAVENOUS

## 2022-04-26 MED ORDER — PROPOFOL 10 MG/ML IV BOLUS
INTRAVENOUS | Status: AC
  Filled 2022-04-26: qty 20

## 2022-04-26 MED ORDER — HEMOSTATIC AGENTS (NO CHARGE) OPTIME
TOPICAL | Status: DC | PRN
  Administered 2022-04-26: 1 via TOPICAL

## 2022-04-26 MED ORDER — ROCURONIUM BROMIDE 10 MG/ML (PF) SYRINGE
PREFILLED_SYRINGE | INTRAVENOUS | Status: AC
Start: 2022-04-26 — End: ?
  Filled 2022-04-26: qty 10

## 2022-04-26 MED ORDER — PROPOFOL 10 MG/ML IV BOLUS
INTRAVENOUS | Status: DC | PRN
  Administered 2022-04-26: 150 mg via INTRAVENOUS

## 2022-04-26 MED ORDER — OXYCODONE-ACETAMINOPHEN 5-325 MG PO TABS: 1.0000 | ORAL_TABLET | Freq: Four times a day (QID) | ORAL | 0 refills | Status: AC | PRN

## 2022-04-26 MED ORDER — LIDOCAINE HCL (PF) 2 % IJ SOLN
INTRAMUSCULAR | Status: AC
  Filled 2022-04-26: qty 5

## 2022-04-26 MED ORDER — FENTANYL CITRATE (PF) 100 MCG/2ML IJ SOLN
25.0000 ug | INTRAMUSCULAR | Status: DC | PRN
  Administered 2022-04-26 (×2): 25 ug via INTRAVENOUS
  Administered 2022-04-26: 50 ug via INTRAVENOUS

## 2022-04-26 MED ORDER — DEXMEDETOMIDINE (PRECEDEX) IN NS 20 MCG/5ML (4 MCG/ML) IV SYRINGE
PREFILLED_SYRINGE | INTRAVENOUS | Status: DC | PRN
  Administered 2022-04-26: 4 ug via INTRAVENOUS
  Administered 2022-04-26 (×2): 8 ug via INTRAVENOUS

## 2022-04-26 MED ORDER — PROPOFOL 10 MG/ML IV BOLUS
INTRAVENOUS | Status: AC
Start: 2022-04-26 — End: ?
  Filled 2022-04-26: qty 20

## 2022-04-26 MED ORDER — DEXAMETHASONE SODIUM PHOSPHATE 10 MG/ML IJ SOLN
INTRAMUSCULAR | Status: AC
  Filled 2022-04-26: qty 1

## 2022-04-26 MED ORDER — LACTATED RINGERS IV SOLN: INTRAVENOUS | Status: DC

## 2022-04-26 MED ORDER — ACETAMINOPHEN 10 MG/ML IV SOLN
INTRAVENOUS | Status: AC
  Filled 2022-04-26: qty 100

## 2022-04-26 MED ORDER — BUPIVACAINE HCL (PF) 0.5 % IJ SOLN
INTRAMUSCULAR | Status: AC
  Filled 2022-04-26: qty 30

## 2022-04-26 MED ORDER — BUPIVACAINE HCL 0.5 % IJ SOLN
INTRAMUSCULAR | Status: DC | PRN
  Administered 2022-04-26: 20 mL

## 2022-04-26 MED ORDER — LIDOCAINE HCL (CARDIAC) PF 100 MG/5ML IV SOSY
PREFILLED_SYRINGE | INTRAVENOUS | Status: DC | PRN
  Administered 2022-04-26: 100 mg via INTRAVENOUS

## 2022-04-26 MED ORDER — ONDANSETRON HCL 4 MG/2ML IJ SOLN
INTRAMUSCULAR | Status: AC
  Filled 2022-04-26: qty 2

## 2022-04-26 MED ORDER — FENTANYL CITRATE (PF) 100 MCG/2ML IJ SOLN
INTRAMUSCULAR | Status: DC | PRN
  Administered 2022-04-26 (×4): 50 ug via INTRAVENOUS

## 2022-04-26 MED ORDER — SODIUM CHLORIDE 0.9 % IR SOLN
Status: DC | PRN
  Administered 2022-04-26: 500 mL
  Administered 2022-04-26: 100 mL

## 2022-04-26 MED ORDER — OXYCODONE HCL 5 MG PO TABS
5.0000 mg | ORAL_TABLET | Freq: Once | ORAL | Status: AC | PRN
  Administered 2022-04-26: 5 mg via ORAL

## 2022-04-26 MED ORDER — IBUPROFEN 600 MG PO TABS: 600.0000 mg | ORAL_TABLET | Freq: Four times a day (QID) | ORAL | 0 refills | Status: AC

## 2022-04-26 MED ORDER — OXYCODONE HCL 5 MG/5ML PO SOLN: 5.0000 mg | Freq: Once | ORAL | Status: AC | PRN

## 2022-04-26 MED ORDER — SUGAMMADEX SODIUM 500 MG/5ML IV SOLN
INTRAVENOUS | Status: DC | PRN
  Administered 2022-04-26: 250 mg via INTRAVENOUS

## 2022-04-26 MED ORDER — ACETAMINOPHEN 10 MG/ML IV SOLN
INTRAVENOUS | Status: DC | PRN
  Administered 2022-04-26: 1000 mg via INTRAVENOUS

## 2022-04-26 MED ORDER — CEFAZOLIN SODIUM-DEXTROSE 2-4 GM/100ML-% IV SOLN
INTRAVENOUS | Status: AC
  Filled 2022-04-26: qty 100

## 2022-04-26 MED ORDER — CHLORHEXIDINE GLUCONATE 0.12 % MT SOLN
OROMUCOSAL | Status: AC
  Administered 2022-04-26: 15 mL via OROMUCOSAL
  Filled 2022-04-26: qty 15

## 2022-04-26 MED ORDER — FAMOTIDINE 20 MG PO TABS
ORAL_TABLET | ORAL | Status: AC
  Administered 2022-04-26: 20 mg via ORAL
  Filled 2022-04-26: qty 1

## 2022-04-26 MED ORDER — DEXMEDETOMIDINE HCL IN NACL 80 MCG/20ML IV SOLN
INTRAVENOUS | Status: AC
Start: 2022-04-26 — End: ?
  Filled 2022-04-26: qty 20

## 2022-04-26 SURGICAL SUPPLY — 65 items
ADH SKN CLS APL DERMABOND .7 (GAUZE/BANDAGES/DRESSINGS) ×3
APL SRG 38 LTWT LNG FL B (MISCELLANEOUS) ×3
APPLICATOR ARISTA FLEXITIP XL (MISCELLANEOUS) ×1 IMPLANT
BAG LAPAROSCOPIC 12 15 PORT 16 (BASKET) IMPLANT
BAG RETRIEVAL 12/15 (BASKET) ×4
BLADE SURG SZ11 CARB STEEL (BLADE) ×4 IMPLANT
CATH FOLEY 2WAY  5CC 16FR (CATHETERS) ×4
CATH FOLEY 2WAY 5CC 16FR (CATHETERS) ×3
CATH URTH 16FR FL 2W BLN LF (CATHETERS) ×3 IMPLANT
COVER TIP SHEARS 8 DVNC (MISCELLANEOUS) ×3 IMPLANT
COVER TIP SHEARS 8MM DA VINCI (MISCELLANEOUS) ×4
DERMABOND ADVANCED (GAUZE/BANDAGES/DRESSINGS) ×1
DERMABOND ADVANCED .7 DNX12 (GAUZE/BANDAGES/DRESSINGS) ×3 IMPLANT
DRAPE 3/4 80X56 (DRAPES) ×4 IMPLANT
DRAPE ARM DVNC X/XI (DISPOSABLE) ×9 IMPLANT
DRAPE COLUMN DVNC XI (DISPOSABLE) ×3 IMPLANT
DRAPE DA VINCI XI ARM (DISPOSABLE) ×16
DRAPE DA VINCI XI COLUMN (DISPOSABLE) ×4
DRAPE ROBOT W/ LEGGING 30X125 (DRAPES) ×1 IMPLANT
DRAPE UNDER BUTTOCK W/FLU (DRAPES) ×4 IMPLANT
DRSG TELFA 3X8 NADH (GAUZE/BANDAGES/DRESSINGS) ×4 IMPLANT
ELECT REM PT RETURN 9FT ADLT (ELECTROSURGICAL) ×4
ELECTRODE REM PT RTRN 9FT ADLT (ELECTROSURGICAL) ×3 IMPLANT
GLOVE BIO SURGEON STRL SZ7 (GLOVE) ×12 IMPLANT
GLOVE SURG UNDER POLY LF SZ7.5 (GLOVE) ×12 IMPLANT
GOWN STRL REUS W/ TWL LRG LVL3 (GOWN DISPOSABLE) ×9 IMPLANT
GOWN STRL REUS W/TWL LRG LVL3 (GOWN DISPOSABLE) ×12
HEMOSTAT ARISTA ABSORB 3G PWDR (HEMOSTASIS) ×1 IMPLANT
IRRIGATION STRYKERFLOW (MISCELLANEOUS) IMPLANT
IRRIGATOR STRYKERFLOW (MISCELLANEOUS) ×8
IV NS 1000ML (IV SOLUTION) ×8
IV NS 1000ML BAXH (IV SOLUTION) ×3 IMPLANT
KIT PINK PAD W/HEAD ARE REST (MISCELLANEOUS) ×4
KIT PINK PAD W/HEAD ARM REST (MISCELLANEOUS) ×3 IMPLANT
MANIFOLD NEPTUNE II (INSTRUMENTS) ×4 IMPLANT
MANIPULATOR VCARE STD CRV RETR (MISCELLANEOUS) ×1 IMPLANT
NEEDLE HYPO 22GX1.5 SAFETY (NEEDLE) ×4 IMPLANT
NS IRRIG 1000ML POUR BTL (IV SOLUTION) ×4 IMPLANT
OBTURATOR OPTICAL STANDARD 8MM (TROCAR) ×4
OBTURATOR OPTICAL STND 8 DVNC (TROCAR) ×3
OBTURATOR OPTICALSTD 8 DVNC (TROCAR) ×3 IMPLANT
OCCLUDER COLPOPNEUMO (BALLOONS) ×1 IMPLANT
PACK LAP CHOLECYSTECTOMY (MISCELLANEOUS) ×4 IMPLANT
PAD DRESSING TELFA 3X8 NADH (GAUZE/BANDAGES/DRESSINGS) IMPLANT
PAD OB MATERNITY 4.3X12.25 (PERSONAL CARE ITEMS) ×4 IMPLANT
PAD PREP 24X41 OB/GYN DISP (PERSONAL CARE ITEMS) ×4 IMPLANT
SCRUB CHG 4% DYNA-HEX 4OZ (MISCELLANEOUS) ×4 IMPLANT
SEAL CANN UNIV 5-8 DVNC XI (MISCELLANEOUS) ×9 IMPLANT
SEAL XI 5MM-8MM UNIVERSAL (MISCELLANEOUS) ×12
SEALER VESSEL DA VINCI XI (MISCELLANEOUS) ×4
SEALER VESSEL EXT DVNC XI (MISCELLANEOUS) IMPLANT
SET CYSTO W/LG BORE CLAMP LF (SET/KITS/TRAYS/PACK) ×1 IMPLANT
SOLUTION ELECTROLUBE (MISCELLANEOUS) ×4 IMPLANT
SURGILUBE 2OZ TUBE FLIPTOP (MISCELLANEOUS) ×4 IMPLANT
SUT DVC VLOC 90 3-0 CV23 VLT (SUTURE) ×8
SUT MNCRL 4-0 (SUTURE) ×8
SUT MNCRL 4-0 27XMFL (SUTURE) ×6
SUTURE DVC VLC 90 3-0 CV23 VLT (SUTURE) IMPLANT
SUTURE MNCRL 4-0 27XMF (SUTURE) ×3 IMPLANT
SYR 10ML LL (SYRINGE) ×4 IMPLANT
SYR 50ML LL SCALE MARK (SYRINGE) ×1 IMPLANT
TRAP SPECIMEN MUCUS 40CC (MISCELLANEOUS) ×1 IMPLANT
TROCAR XCEL NON-BLD 5MMX100MML (ENDOMECHANICALS) ×1 IMPLANT
TUBING EVAC SMOKE HEATED PNEUM (TUBING) ×4 IMPLANT
WATER STERILE IRR 500ML POUR (IV SOLUTION) ×4 IMPLANT

## 2022-04-26 NOTE — Anesthesia Preprocedure Evaluation (Signed)
Anesthesia Evaluation  Patient identified by MRN, date of birth, ID band Patient awake    Reviewed: Allergy & Precautions, NPO status , Patient's Chart, lab work & pertinent test results  History of Anesthesia Complications Negative for: history of anesthetic complications  Airway Mallampati: III  TM Distance: >3 FB Neck ROM: full    Dental  (+) Teeth Intact, Chipped   Pulmonary neg pulmonary ROS, neg shortness of breath, neg sleep apnea, neg COPD, neg recent URI,    Pulmonary exam normal        Cardiovascular (-) angina(-) CAD, (-) Past MI and (-) CABG negative cardio ROS Normal cardiovascular exam     Neuro/Psych negative neurological ROS  negative psych ROS   GI/Hepatic negative GI ROS, Neg liver ROS,   Endo/Other  Hypothyroidism   Renal/GU      Musculoskeletal   Abdominal   Peds  Hematology negative hematology ROS (+)   Anesthesia Other Findings Past Medical History: No date: Hypothyroid  Past Surgical History: 02/13/2022: CYSTOSCOPY     Comment:  Procedure: CYSTOSCOPY;  Surgeon: Will Bonnet, MD;              Location: [facility] ORS;  Service: Gynecology;; 2003: LUMBAR FUSION 02/13/2022: OVARIAN CYST REMOVAL; Right     Comment:  Procedure: OVARIAN CYSTECTOMY;  Surgeon: Will Bonnet, MD;  Location: [facility] ORS;  Service: Gynecology;              Laterality: Right; 02/13/2022: ROBOTIC ASSISTED SALPINGO OOPHERECTOMY; Right     Comment:  Procedure: ROBOTIC ASSISTED SALPINGO OOPHORECTOMY;                Surgeon: Will Bonnet, MD;  Location: [facility] ORS;                Service: Gynecology;  Laterality: Right;  BMI    Body Mass Index: 33.09 kg/m      Reproductive/Obstetrics negative OB ROS                             Anesthesia Physical Anesthesia Plan  ASA: 2  Anesthesia Plan: General ETT and General   Post-op Pain Management:    Induction:  Intravenous  PONV Risk Score and Plan: 4 or greater and Ondansetron, Dexamethasone and Midazolam  Airway Management Planned: Oral ETT  Additional Equipment:   Intra-op Plan:   Post-operative Plan: Extubation in OR  Informed Consent: I have reviewed the patients History and Physical, chart, labs and discussed the procedure including the risks, benefits and alternatives for the proposed anesthesia with the patient or authorized representative who has indicated his/her understanding and acceptance.     Dental Advisory Given  Plan Discussed with: Anesthesiologist, CRNA and Surgeon  Anesthesia Plan Comments: (Patient consented for risks of anesthesia including but not limited to:  - adverse reactions to medications - damage to eyes, teeth, lips or other oral mucosa - nerve damage due to positioning  - sore throat or hoarseness - Damage to heart, brain, nerves, lungs, other parts of body or loss of life  Patient voiced understanding.)        Anesthesia Quick Evaluation

## 2022-04-26 NOTE — Op Note (Addendum)
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  Robotic assisted total hysterectomy, right salpingo-oophorectomy, and staging with biopsies, omental biopsy, and washings.  Cystoscopy.  SURGEON: Surgeon(s) and Role: Panel 1:   Prentice Docker - Robotic assisted total hysterectomy and cystoscopy.   Panel 2:    * [place] Gaetana Michaelis, MD -  Right salpingo-oophorectomy, and staging with biopsies, omental biopsy, and washings.  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 There were no palpable adnexal masses or nodularity. The parametria was smooth. The cervix was negative for gross lesions. Intraoperative findings included normal-appearing uterus. The left adnexa was abnormal with a 4 cm cyst and multiple excrescences. The upper abdomen was normal including omentum, bowel, liver, stomach, and diaphragmatic surfaces. There was no evidence of grossly malignant pelvic or right para-aortic lymph nodes.  There were numerous small nodules less than 5 mm along the posterior aspect of the uterus the right uterosacral ligament and right pelvic peritoneum.  PROCEDURE IN DETAIL: After informed consent was obtained, the patient was taken to the operating room where anesthesia was obtained without difficulty. The patient was positioned in the dorsal lithotomy position in [place] and her  arms were carefully tucked at her sides and the usual precautions were taken.  She was prepped and draped in normal sterile fashion.  Time-out was performed and a Foley catheter was placed into the bladder and a standard VCare uterine manipulator was then placed in the uterus without incident.     Operative entry was obtained via a supraumbilical incision and direct entry with visualization. The abdomen insuffulated, and the abdomen and pelvis visualized with noted findings above. T The robotic 8 mm ports and Left mid quadrant port 5 mm placement performed. The patient was placed in Trendelenburg and the bowel was displaced up into the upper abdomen.  Cytologic washings were obtained.  Robotic docking was performed and instruments placed.  Staging with biopsies were obtained from bilateral pelvic and paracolic peritoneal surfaces, the remnant of the right round ligament, the remainder of the right intraperitoneal ligaments, the anterior cul-de-sac, and the right uterosacral cul-de-sac.  Due to the concerning appearance of some of the uterine and cul-de-sac lesions the right uterosacral biopsy was sent for frozen.  This revealed atypical cells.  Therefore the decision was made to proceed with hysterectomy in order to remove all of those abnormal posterior lesions. A portion of the omentum was biopsied.  The numerous small nodules less than 5 mm along the posterior aspect of the uterus the right uterosacral ligament and right pelvic peritoneum were either removed or ablated.  Round ligaments were divided on each side with the EndoShears and the retroperitoneal space was opened on the left.  The ureters were identified and preserved. The left infundibulopelvic ligament was skeletonized, sealed and divided with the vessel sealer device.  A bladder flap was created and the bladder was dissected down off the lower uterine segment and cervix using endoshears and electrocautery.  The uterine arteries were  skeletonized bilaterally, sealed and divided with the vessel sealer  device.  A colpotomy was performed circumferentially along the V-Care ring with electrocautery and the cervix was incised from the vagina and the specimen was removed through the vagina.  Next the omental biopsy was removed.  A laparoscopic bag was inserted into the vagina and the left ovarian cystic mass was placed in this bag.  This was removed intact via the vagina and sent to Pathology for frozen section.  The pathology results were negative for malignancy.  A pneumo balloon was placed in the vagina and the vaginal cuff was then closed in a running continuous fashion using V-Lock suture with careful attention to include the vaginal cuff angles and the vaginal mucosa within the closure.    Hemostasis was observed. The intraperitoneal pressure was dropped to 5 mmHg, and all planes of dissection, vascular pedicles and the vaginal cuff and pedicles were found to be hemostatic.  Arista was placed along the vaginal cuff closure to ensure ongoing hemostasis.  The trocars were removed under visualization.   Before the umbilical trocar was removed the CO2 gas was released. The skin incisions were closed with suture and glue.  The patient tolerated the procedure well.  Sponge, lap and needle counts were correct x2.  The patient was taken to recovery room in excellent condition.  Cystoscopy was performed by Dr. Glennon Mac. There was bilateral ureteral spill and no evidence of bladder injury.    Antibiotics: Given 1st or 2nd generation cephalosporin, Antibiotics given within 1 hour of the start of the procedure, Antibiotics ordered to be discontinued within 24 hours post procedure   VTE prophylaxis: was ordered perioperatively.  I assisted Dr. Glennon Mac with the procedure which could not have been performed without my assistance. This was a high-level case requiring a Insurance risk surveyor. I performed the initial abdominal entry, helped with  robotic port placement, docking, and insertion of robotic instruments.  I also performed the  staging biopsies, omental biopsy, left salpingo-oophorectomy, and a portion of the hysterectomy.  Dr. Glennon Mac the remainder of the procedure.. I also provided high level assistance throughout the procedure that was needed for uterine manipulation and removal of specimens.    Gillis Ends, MD   I have read the above note and agree with the portions indicated as being performed by me.  I performed the remained of the hysterectomy not performed by Dr. Theora Gianotti as described above.  Prentice Docker, MD, [facility] 04/26/2022 11:02 AM

## 2022-04-26 NOTE — Anesthesia Postprocedure Evaluation (Signed)
Anesthesia Post Note  Patient: Brandi Deleon  Procedure(s) Performed: XI ROBOTIC ASSISTED LEFT SALPINGO OOPHORECTOMY, PARTIAL OMENECTOMY, PERITONEAL BIOPSIES (Left) XI ROBOTIC ASSISTED LAPAROSCOPIC HYSTERECTOMY CYSTOSCOPY  Patient location during evaluation: PACU Anesthesia Type: General Level of consciousness: awake and alert Pain management: pain level controlled Vital Signs Assessment: post-procedure vital signs reviewed and stable Respiratory status: spontaneous breathing, nonlabored ventilation, respiratory function stable and patient connected to nasal cannula oxygen Cardiovascular status: blood pressure returned to baseline and stable Postop Assessment: no apparent nausea or vomiting Anesthetic complications: no   No notable events documented.   Last Vitals:  Vitals:   04/26/22 1130 04/26/22 1145  BP: (!) 140/98 (!) 150/99  Pulse: 99 100  Resp: 17 15  Temp: (!) 36.1 C (!) 36.3 C  SpO2: 97% 100%    Last Pain:  Vitals:   04/26/22 1145  TempSrc: Temporal  PainSc: [place]

## 2022-04-26 NOTE — Interval H&P Note (Signed)
History and Physical Interval Note:  04/26/2022 7:26 AM  Redgie Grayer [place]  has presented today for surgery, with the diagnosis of serous cystadenofibroma.  The various methods of treatment have been discussed with the patient and family. After consideration of risks, benefits and other options for treatment, the patient has consented to  Procedure(s): XI ROBOTIC ASSISTED LEFT SALPINGO OOPHORECTOMYPOSSIBLE OMENECTOMY, POSSIBLE PERITONEAL BIOPSIES, POSSIBLE HYSTERECTOMY WITH REMOVAL OF CERVIX (Left) as a surgical intervention.  The patient's history has been reviewed, patient examined, no change in status, stable for surgery.  I have reviewed the patient's chart and labs.  Questions were answered to the patient's satisfaction.  Consents reviewed and patient wishes to proceed. Left side marked.  To OR when ready.  Prentice Docker, MD, [facility] 04/26/2022 7:26 AM

## 2022-04-26 NOTE — Anesthesia Procedure Notes (Signed)
Procedure Name: Intubation Date/Time: 04/26/2022 7:41 AM  Performed by: Tollie Eth, CRNAPre-anesthesia Checklist: Patient identified, Patient being monitored, Timeout performed, Emergency Drugs available and Suction available Patient Re-evaluated:Patient Re-evaluated prior to induction Oxygen Delivery Method: Circle system utilized Preoxygenation: Pre-oxygenation with 100% oxygen Induction Type: IV induction Ventilation: Mask ventilation without difficulty Laryngoscope Size: 3 and McGraph Grade View: Grade I Tube type: Oral Tube size: 7.0 mm Number of attempts: 1 Airway Equipment and Method: Stylet and Video-laryngoscopy Placement Confirmation: ETT inserted through vocal cords under direct vision, positive ETCO2 and breath sounds checked- equal and bilateral Secured at: 21 cm Tube secured with: Tape Dental Injury: Teeth and Oropharynx as per pre-operative assessment

## 2022-04-26 NOTE — Transfer of Care (Signed)
Immediate Anesthesia Transfer of Care Note  Patient: Brandi Deleon  Procedure(s) Performed: XI ROBOTIC ASSISTED LEFT SALPINGO OOPHORECTOMY, PARTIAL OMENECTOMY, PERITONEAL BIOPSIES (Left) XI ROBOTIC ASSISTED LAPAROSCOPIC HYSTERECTOMY CYSTOSCOPY  Patient Location: PACU  Anesthesia Type:General  Level of Consciousness: awake and alert   Airway & Oxygen Therapy: Patient Spontanous Breathing and Patient connected to face mask oxygen  Post-op Assessment: Report given to RN and Post -op Vital signs reviewed and stable  Post vital signs: Reviewed and stable  Last Vitals:  Vitals Value Taken Time  BP 142/89 04/26/22 1049  Temp    Pulse 102 04/26/22 1051  Resp 13 04/26/22 1051  SpO2 96 % 04/26/22 1051  Vitals shown include unvalidated device data.  Last Pain:  Vitals:   04/26/22 0633  TempSrc: Temporal  PainSc: 0-No pain         Complications: No notable events documented.

## 2022-04-26 NOTE — Discharge Instructions (Signed)
AMBULATORY SURGERY  ?DISCHARGE INSTRUCTIONS ? ? ?The drugs that you were given will stay in your system until tomorrow so for the next 24 hours you should not: ? ?Drive an automobile ?Make any legal decisions ?Drink any alcoholic beverage ? ? ?You may resume regular meals tomorrow.  Today it is better to start with liquids and gradually work up to solid foods. ? ?You may eat anything you prefer, but it is better to start with liquids, then soup and crackers, and gradually work up to solid foods. ? ? ?Please notify your doctor immediately if you have any unusual bleeding, trouble breathing, redness and pain at the surgery site, drainage, fever, or pain not relieved by medication. ? ? ? ?Additional Instructions: ? ? ? ?Please contact your physician with any problems or Same Day Surgery at [phone], Monday through Friday 6 am to 4 pm, or [place] [facility] [place] Main number at [phone].  ?

## 2022-04-27 ENCOUNTER — Encounter: Payer: Self-pay | Admitting: Obstetrics and Gynecology

## 2022-04-27 LAB — CYTOLOGY - NON PAP

## 2022-05-17 ENCOUNTER — Ambulatory Visit
Admission: RE | Admit: 2022-05-17 | Discharge: 2022-05-17 | Disposition: A | Discharge status: Home / Self Care | Payer: Commercial Managed Care - PPO | Source: Ambulatory Visit | Attending: Family | Admitting: Family

## 2022-05-17 DIAGNOSIS — Z1231 Encounter for screening mammogram for malignant neoplasm of breast: Secondary | ICD-10-CM | POA: Diagnosis not present

## 2022-05-19 ENCOUNTER — Telehealth: Payer: Self-pay | Admitting: Obstetrics and Gynecology

## 2022-05-19 NOTE — Telephone Encounter (Signed)
Hello! This pt called our office and stated she was supposed to schedule a follow up to see Dr. Theora Gianotti. When would you like her to come back?

## 2022-05-20 NOTE — Telephone Encounter (Signed)
9/20 please. Thank you.

## 2022-05-22 LAB — SURGICAL PATHOLOGY

## 2022-05-31 ENCOUNTER — Inpatient Hospital Stay: Payer: Commercial Managed Care - PPO

## 2022-06-07 ENCOUNTER — Inpatient Hospital Stay: Payer: Commercial Managed Care - PPO | Attending: Obstetrics and Gynecology | Admitting: Obstetrics and Gynecology

## 2022-06-07 ENCOUNTER — Encounter: Payer: Self-pay | Admitting: Obstetrics and Gynecology

## 2022-06-07 VITALS — BP 137/105 | HR 102 | Temp 97.8°F | Resp 19 | Wt 244.1 lb

## 2022-06-07 DIAGNOSIS — N83201 Unspecified ovarian cyst, right side: Secondary | ICD-10-CM

## 2022-06-07 DIAGNOSIS — N83202 Unspecified ovarian cyst, left side: Secondary | ICD-10-CM

## 2022-06-07 DIAGNOSIS — D270 Benign neoplasm of right ovary: Secondary | ICD-10-CM

## 2022-06-07 NOTE — Patient Instructions (Signed)
See Dr. Glennon Mac in 6 months with labs.

## 2022-06-07 NOTE — Progress Notes (Signed)
Gynecologic Oncology Post Op Visit   Virtual Visit Progress Note  I connected with Brandi Deleon on 06/07/22 at  8:30 AM EDT by video enabled telemedicine visit and verified that I am speaking with the correct person using two identifiers.   I discussed the limitations, risks, security and privacy concerns of performing an evaluation and management service by telemedicine and the availability of in-person appointments. I also discussed with the patient that there may be a patient responsible charge related to this service. The patient expressed understanding and agreed to proceed.   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  Subjective:  Brandi Deleon is a 51 y.o. G62 female who is seen in consultation from Dr. 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  The left adnexa was abnormal with a 4 cm cyst and multiple excrescences. The upper abdomen was normal including omentum, bowel, liver, stomach, and diaphragmatic surfaces. There was no evidence of grossly malignant pelvic or right para-aortic lymph nodes.  There were numerous small nodules less than 5 mm along the posterior aspect of the uterus the right uterosacral ligament and right pelvic peritoneum.Found to have serous cystadenofibroma of ovary with area of serous proliferation in right uterosacral biopsy. Post op visit today.    Pathology DIAGNOSIS:  A. UTEROSACRAL LIGAMENT, RIGHT; BIOPSY:  - ATYPICAL LOW-GRADE SEROUS PROLIFERATION.   B.  OVARY, LEFT; OOPHORECTOMY:  - SEROUS CYSTADENOFIBROMA WITH ATYPICAL LOW-GRADE SEROUS PROLIFERATION.  - BACKGROUND BENIGN PHYSIOLOGIC CHANGES.  - FALLOPIAN TUBE WITH NO SIGNIFICANT HISTOPATHOLOGIC CHANGE.  - NEGATIVE FOR MALIGNANCY.   C. UTERUS WITH CERVIX AND LEFT FALLOPIAN TUBES; TOTAL HYSTERECTOMY WITH  LEFT SALPINGECTOMY:  - UTERINE CERVIX:       - BENIGN TRANSFORMATION ZONE.       - NEGATIVE FOR SQUAMOUS INTRAEPITHELIAL LESION.  - ENDOMETRIUM:       - BENIGN ENDOMETRIAL POLYP.       - LATE PROLIFERATIVE PHASE ENDOMETRIUM.       - NEGATIVE FOR ATYPICAL HYPERPLASIA/EIN AND MALIGNANCY.  - MYOMETRIUM:       - ADENOMYOSIS.       - NEGATIVE FOR FEATURES OF MALIGNANCY.  - UTERINE SEROSA:       - NO SIGNIFICANT HISTOPATHOLOGIC CHANGE.   D. SOFT TISSUE, RIGHT PELVIC; BIOPSY:  - BENIGN MESOTHELIAL LINED FIBROADIPOSE TISSUE.  - NEGATIVE FOR ATYPIA AND MALIGNANCY.   E. SOFT TISSUE, RIGHT INFUNDIBULOPELVIC LIGAMENT; BIOPSY:  - BENIGN FIBROADIPOSE AND FIBROMUSCULAR TISSUE.  - NEGATIVE FOR ATYPIA AND MALIGNANCY.   F. SOFT TISSUE, RIGHT PERICOLIC; BIOPSY:  - BENIGN MESOTHELIAL LINED FIBROADIPOSE TISSUE.  - NEGATIVE FOR ATYPIA AND MALIGNANCY.   G. SOFT TISSUE, RIGHT INFERIOR PELVIC; BIOPSY:  - BENIGN FIBROUS AND FIBROADIPOSE TISSUE.  - NEGATIVE FOR ATYPIA AND MALIGNANCY.   H. SOFT TISSUE, ANTERIOR CUL-DE-SAC; BIOPSY:  - BENIGN MESOTHELIAL LINED FIBROADIPOSE AND FIBROMUSCULAR TISSUE.  - NEGATIVE FOR ATYPIA AND MALIGNANCY.   I. SOFT TISSUE, LEFT PELVIC; BIOPSY:  - HEAVILY CAUTERIZED MESOTHELIAL LINED FIBROUS TISSUE.  - NO DEFINITE EVIDENCE OF ATYPIA OR MALIGNANCY   J. SOFT  TISSUE, LEFT PERICOLIC; BIOPSY:  - BENIGN MESOTHELIAL LINED FIBROADIPOSE TISSUE WITH SIGNIFICANT CAUTERY  ARTIFACT.  - NO DEFINITE EVIDENCE OF ATYPIA OR MALIGNANCY.   K. OMENTUM; OMENTECTOMY:  - PREDOMINANTLY BENIGN FIBROADIPOSE TISSUE, WITH FOCAL REACTIVE  MESOTHELIAL HYPERPLASIA.   Comment:  The entire  ovarian cyst wall was submitted for histologic evaluation.  Multiple additional deeper recut levels were examined for parts I and J.  In addition, the hysterectomy specimen (part C) was re-examined for  potential serosal nodules, and one minute focus was submitted for  histologic evaluation, without identification of significant  histopathologic change.   Gyn Oncology History Presented to ED 02/13/22 for pain.  Found to have bilateral ovarian cystic masses. Right 7.5 cm and had emergent surgery for torsion.     CA125 = 71.6  CT scan FINDINGS: Lower chest: No acute abnormality.   Hepatobiliary: No focal liver abnormality is seen. No gallstones, gallbladder wall thickening, or biliary dilatation.   Pancreas: Unremarkable. No pancreatic ductal dilatation or surrounding inflammatory changes.   Spleen: Normal in size without focal abnormality.   Adrenals/Urinary Tract: Adrenal glands are unremarkable. Kidneys are normal, without renal calculi, focal lesion, or hydronephrosis. Bladder is unremarkable.   Stomach/Bowel: Stomach is within normal limits. Appendix appears normal. Mild scattered descending and sigmoid colonic diverticula without surrounding inflammatory changes. No evidence of bowel wall thickening, distention, or inflammatory changes.   Vascular/Lymphatic: No significant vascular findings are present. No enlarged abdominal or pelvic lymph nodes.   Reproductive: Complex appearing right adnexal cyst measuring up to approximately 4.6 x 4.6 x 7.5 cm (AP by trans by cc) with mild surrounding fat stranding. Bilobed left simple appearing adnexal cysts measuring 4.9 x 4.5 x 5.1 cm posteriorly and 6.8 x 7.1 x 7.0 cm anteriorly. No significant pelvic ascites. The uterus is present unremarkable.   Other: No abdominal wall hernia or abnormality. No abdominopelvic ascites.   Musculoskeletal: Postsurgical changes after L4-S1 posterior spinal instrumented fusion without  complicating features. No acute osseous abnormality.   IMPRESSION: 1. Enlarged (measuring up to 7.5 cm), complex cystic appearance of the right adnexa with mild surrounding fat stranding. Given clinical presentation, these findings are concerning for ovarian torsion. Recommend gynecologic consultation and pelvic ultrasound for further characterization. 2. Bilobed versus 2 adjacent left adnexal cysts measuring up to 5.1 and 7.0 cm. Recommend attention on pelvic ultrasound as in recommended impression point. 3. Diverticulosis, no evidence of diverticulitis.  Underwent Robotic RSO 02/13/22 for torsion. Findings:  1) enlarged, multilobular right ovary with evidence of torsion about the vascular pedicle.  The ovary with blue-black in appearance with apparent edema in the fallopian tube and ovary 2) left ovarian cyst that was in the deep pelvis. This appears to be more well-defined in shape.  No evidence of torsion   Pathology A.  OVARY AND FALLOPIAN TUBE, RIGHT; SALPINGO-OOPHORECTOMY:  - OVARIAN SEROUS CYST ADENOFIBROMA WITH FOCAL EPITHELIAL PROLIFERATION,  SEE COMMENT.  - FALLOPIAN TUBE WITH BENIGN PARATUBAL CYST.  - HEMORRHAGIC CORPUS LUTEUM.  - NEGATIVE FOR MALIGNANCY.   Comment:  Sections demonstrate an ovarian serous cyst adenofibroma with focal  areas with serous borderline tumor morphology. Stromal invasion is not  identified. By gross examination, the tissue with serous borderline  tumor morphology involves less than 10% of the ovarian mass. As per  current WHO classification criteria, this lesion is consistent with a  serous cyst adenofibroma with focal epithelial proliferation.   Washings negative.   Used OCPs age [age].  No contraception since then and  has never gotten pregnant.  Still has regular periods, no DUB.      Problem List: Patient Active Problem List   Diagnosis Date Noted   Left ovarian cyst 04/26/2022   Serous cystadenofibroma, right 03/15/2022   Bilateral  ovarian cysts 02/13/2022   Ovarian torsion 02/13/2022    Past Medical History: Past Medical History:  Diagnosis Date   Hypothyroid     Past Surgical History: Past Surgical History:  Procedure Laterality Date   CYSTOSCOPY  02/13/2022   Procedure: CYSTOSCOPY;  Surgeon: Will Bonnet, MD;  Location: [facility];  Service: Gynecology;;   Consuela Mimes  04/26/2022   Procedure: CYSTOSCOPY;  Surgeon: Will Bonnet, MD;  Location: [facility];  Service: Gynecology;;   LUMBAR FUSION  2003   OVARIAN CYST REMOVAL Right 02/13/2022   Procedure: OVARIAN CYSTECTOMY;  Surgeon: Will Bonnet, MD;  Location: [facility];  Service: Gynecology;  Laterality: Right;   ROBOTIC ASSISTED BILATERAL SALPINGO OOPHERECTOMY Left 04/26/2022   Procedure: XI ROBOTIC ASSISTED LEFT SALPINGO OOPHORECTOMY, PARTIAL OMENECTOMY, PERITONEAL BIOPSIES;  Surgeon: Will Bonnet, MD;  Location: [facility];  Service: Gynecology;  Laterality: Left;   ROBOTIC ASSISTED LAPAROSCOPIC HYSTERECTOMY AND SALPINGECTOMY N/A 04/26/2022   Procedure: XI ROBOTIC ASSISTED LAPAROSCOPIC HYSTERECTOMY;  Surgeon: Will Bonnet, MD;  Location: [facility];  Service: Gynecology;  Laterality: N/A;   ROBOTIC ASSISTED SALPINGO OOPHERECTOMY Right 02/13/2022   Procedure: ROBOTIC ASSISTED SALPINGO OOPHORECTOMY;  Surgeon: Will Bonnet, MD;  Location: [facility];  Service: Gynecology;  Laterality: Right;       OB History:  OB History  Gravida Para Term Preterm AB Living  0 0 0 0 0 0  SAB IAB Ectopic Multiple Live Births  0 0 0 0 0    Family History: Family History  Problem Relation Age of Onset   Cancer - Lung Father    Ovarian cysts Sister    Cancer Maternal Grandmother    Cancer Paternal Grandmother    Breast cancer Neg Hx     Social History: Social History   Socioeconomic History   Marital status: Widowed    Spouse name: Not on file   Number of children: Not on file   Years of education: Not on file   Highest education level:  Not on file  Occupational History   Not on file  Tobacco Use   Smoking status: Never   Smokeless tobacco: Never  Vaping Use   Vaping Use: Never used  Substance and Sexual Activity   Alcohol use: Not Currently    Comment: 1 beer weekly   Drug use: Not Currently    Types: Marijuana   Sexual activity: Yes    Partners: Male    Birth control/protection: None  Other Topics Concern   Not on file  Social History Narrative   Not on file   Social Determinants of Health   Financial Resource Strain: Not on file  Food Insecurity: Not on file  Transportation Needs: Not on file  Physical Activity: Not on file  Stress: Not on file  Social Connections: Not on file  Intimate Partner Violence: Not on file    Allergies: No Known Allergies  Current Medications: Current Outpatient Medications  Medication Sig Dispense Refill   buPROPion (WELLBUTRIN XL) 150 MG 24 hr tablet Take 150 mg by mouth daily.     ibuprofen (ADVIL) 600 MG tablet Take 1 tablet (600 mg total) by mouth every 6 (six) hours. 30 tablet 0   levothyroxine (SYNTHROID) 88 MCG  tablet Take 88 mcg by mouth daily before breakfast.     Multiple Vitamin (MULTIVITAMIN) tablet Take 1 tablet by mouth daily.     No current facility-administered medications for this visit.    Review of Systems General: negative for, fevers, chills, fatigue, changes in sleep, changes in weight or appetite Skin: negative for changes in color, texture, moles or lesions Eyes: negative for, changes in vision, pain, diplopia HEENT: negative for, change in hearing, pain, discharge, tinnitus, vertigo, voice changes, sore throat, neck masses Breasts: negative for breast lumps Pulmonary: negative for, dyspnea, orthopnea, productive cough Cardiac: negative for, palpitations, syncope, pain, discomfort, pressure Gastrointestinal: negative for, dysphagia, nausea, vomiting, jaundice, pain, constipation, diarrhea, hematemesis, hematochezia Genitourinary/Sexual:  negative for, dysuria, discharge, hesitancy, nocturia, retention, stones, infections, STD's, incontinence Musculoskeletal: negative for, pain, stiffness, swelling, range of motion limitation Hematology: negative for, easy bruising, bleeding Neurologic/Psych: negative for, headaches, seizures, paralysis, weakness, tremor, change in gait, change in sensation, mood swings, depression, anxiety, change in memory  Objective:  Physical Examination:  LMP 03/11/2022 (Approximate)    ECOG Performance Status: 1 - Symptomatic but completely ambulatory  General appearance: alert, cooperative, and appears stated age Abdomen: soft, non-tender, without masses or organomegaly, no hernias, and well healed incisions  Assessment:  Brandi Deleon is a 51 y.o. G0 premenopausal female diagnosed with 7.5 cm right ovarian serous cystadenofibroma with 10% serous borderline component at emergency surgery for torsion 02/13/22.  CA125 71.6. Washings negative.  Sections demonstrate an ovarian serous cyst adenofibroma with focal areas with serous borderline tumor morphology. Stromal invasion is not identified. By gross examination, the tissue with serous borderline tumor morphology involves less than 10% of the ovarian mass.  As per current WHO classification criteria, this lesion is consistent with a serous cyst adenofibroma with focal epithelial proliferation.   Large bilobed cyst up to 7 cm noted in left ovary on CT scan.  Told her 7/23 that the prognosis should be excellent, but that other adnexa should also be removed.  On 04/26/22 she underwent TLH, LSO, peritoneal biopsies, omental biopsy, washings.  The left adnexa was abnormal with a 4 cm cyst and multiple excrescences. There were numerous small nodules less than 5 mm along the posterior aspect of the uterus the right uterosacral ligament and right pelvic peritoneum.Found to have serous cystadenofibroma of ovary with area of serous proliferation in right  uterosacral biopsy. Post op visit today.    Medical co-morbidities complicating care: BMI 33..  Plan:   Problem List Items Addressed This Visit       Endocrine   Bilateral ovarian cysts - Primary   Serous cystadenofibroma, right    Normal postop exam.  Final pathology consistent with bilateral ovarian serous cystadenofibromas. First one removed on the right had some atypical areas with serous borderline tumor.  One on the left removed recently had surface excresences, but no borderline changes and positive uterosacral biopsy for serous atypical cells.    Staging otherwise negative.  Discussed that the risk of recurrence should be very low and recommended surveillance.   Return to clinic  She will see Dr Glennon Mac back for post op for pelvic exam today.  Then can alternate visits q 6 months with CA125 since this was elevated at initial diagnosis.  with Dr Glennon Mac and Gyn Onc.  The patient's diagnosis, an outline of the further diagnostic and laboratory studies which will be required, the recommendation, and alternatives were discussed.  All questions were answered to the patient's satisfaction.   I  discussed the assessment and treatment plan with the patient. The patient was provided an opportunity to ask questions and all were answered. The patient agreed with the plan and demonstrated an understanding of the instructions.   The patient was advised to call back or seek an in-person evaluation if the symptoms worsen or if the condition fails to improve as anticipated.   I spent 20 minutes face-to-face video visit time dedicated to the care of this patient on the date of this encounter to include pre-visit review of <specify which records>, face-to-face time with the patient, and post visit ordering of testing/documentation.   Mellody Drown, MD  CC:  [address] [place] [place],  [place] [address] [phone]

## 2022-06-08 ENCOUNTER — Other Ambulatory Visit: Payer: Self-pay

## 2022-06-08 DIAGNOSIS — D270 Benign neoplasm of right ovary: Secondary | ICD-10-CM

## 2022-09-29 ENCOUNTER — Emergency Department
Admission: EM | Admit: 2022-09-29 | Discharge: 2022-09-29 | Discharge status: Against Medical Advice | Payer: Commercial Managed Care - PPO | Attending: Emergency Medicine | Admitting: Emergency Medicine

## 2022-09-29 ENCOUNTER — Other Ambulatory Visit: Payer: Self-pay

## 2022-09-29 DIAGNOSIS — R1032 Left lower quadrant pain: Secondary | ICD-10-CM | POA: Diagnosis present

## 2022-09-29 DIAGNOSIS — Z5321 Procedure and treatment not carried out due to patient leaving prior to being seen by health care provider: Secondary | ICD-10-CM | POA: Diagnosis not present

## 2022-09-29 LAB — CBC
HCT: 44.3 % (ref 36.0–46.0)
Hemoglobin: 14.8 g/dL (ref 12.0–15.0)
MCH: 28.9 pg (ref 26.0–34.0)
MCHC: 33.4 g/dL (ref 30.0–36.0)
MCV: 86.5 fL (ref 80.0–100.0)
Platelets: 234 10*3/uL (ref 150–400)
RBC: 5.12 MIL/uL — ABNORMAL HIGH (ref 3.87–5.11)
RDW: 12.7 % (ref 11.5–15.5)
WBC: 7.4 10*3/uL (ref 4.0–10.5)
nRBC: 0 % (ref 0.0–0.2)

## 2022-09-29 LAB — LIPASE, BLOOD: Lipase: 48 U/L (ref 11–51)

## 2022-09-29 LAB — COMPREHENSIVE METABOLIC PANEL
ALT: 21 U/L (ref 0–44)
AST: 17 U/L (ref 15–41)
Albumin: 4.3 g/dL (ref 3.5–5.0)
Alkaline Phosphatase: 71 U/L (ref 38–126)
Anion gap: 7 (ref 5–15)
BUN: 17 mg/dL (ref 6–20)
CO2: 28 mmol/L (ref 22–32)
Calcium: 9.5 mg/dL (ref 8.9–10.3)
Chloride: 103 mmol/L (ref 98–111)
Creatinine, Ser: 0.87 mg/dL (ref 0.44–1.00)
GFR, Estimated: >60 mL/min (ref >60)
Glucose, Bld: 122 mg/dL — ABNORMAL HIGH (ref 70–99)
Potassium: 3.8 mmol/L (ref 3.5–5.1)
Sodium: 138 mmol/L (ref 135–145)
Total Bilirubin: 0.8 mg/dL (ref 0.3–1.2)
Total Protein: 7.7 g/dL (ref 6.5–8.1)

## 2022-09-29 NOTE — ED Triage Notes (Signed)
Pt comes from home via POV c/o lower left abd pain. Pt states it started around 8pm last night, describes it as shooting pain and radiates towards belly button sometimes. Pt in NAD at this time, denies CP, SOB, vomiting or diarrhea.

## 2022-10-24 ENCOUNTER — Other Ambulatory Visit (HOSPITAL_BASED_OUTPATIENT_CLINIC_OR_DEPARTMENT_OTHER): Payer: Self-pay

## 2022-10-24 DIAGNOSIS — R0681 Apnea, not elsewhere classified: Secondary | ICD-10-CM

## 2022-10-24 DIAGNOSIS — R0683 Snoring: Secondary | ICD-10-CM

## 2022-10-24 DIAGNOSIS — G4700 Insomnia, unspecified: Secondary | ICD-10-CM

## 2023-04-03 ENCOUNTER — Other Ambulatory Visit: Payer: Self-pay | Admitting: Internal Medicine

## 2023-04-03 DIAGNOSIS — Z1231 Encounter for screening mammogram for malignant neoplasm of breast: Secondary | ICD-10-CM

## 2023-05-21 ENCOUNTER — Ambulatory Visit
Admission: RE | Admit: 2023-05-21 | Discharge: 2023-05-21 | Disposition: A | Discharge status: Home / Self Care | Payer: Commercial Managed Care - PPO | Source: Ambulatory Visit | Attending: Internal Medicine | Admitting: Internal Medicine

## 2023-05-21 DIAGNOSIS — Z1231 Encounter for screening mammogram for malignant neoplasm of breast: Secondary | ICD-10-CM | POA: Diagnosis present

## 2023-06-06 ENCOUNTER — Other Ambulatory Visit: Payer: Commercial Managed Care - PPO

## 2023-06-06 ENCOUNTER — Ambulatory Visit: Payer: Commercial Managed Care - PPO

## 2023-06-11 ENCOUNTER — Other Ambulatory Visit: Payer: Self-pay | Admitting: *Deleted

## 2023-06-11 DIAGNOSIS — D270 Benign neoplasm of right ovary: Secondary | ICD-10-CM

## 2023-06-13 ENCOUNTER — Inpatient Hospital Stay (HOSPITAL_BASED_OUTPATIENT_CLINIC_OR_DEPARTMENT_OTHER): Payer: Commercial Managed Care - PPO | Admitting: Obstetrics and Gynecology

## 2023-06-13 ENCOUNTER — Encounter: Payer: Self-pay | Admitting: Obstetrics and Gynecology

## 2023-06-13 ENCOUNTER — Inpatient Hospital Stay: Payer: Commercial Managed Care - PPO | Attending: Oncology

## 2023-06-13 VITALS — BP 125/94 | HR 86 | Temp 97.6°F | Resp 19 | Wt 270.0 lb

## 2023-06-13 DIAGNOSIS — Z90710 Acquired absence of both cervix and uterus: Secondary | ICD-10-CM | POA: Insufficient documentation

## 2023-06-13 DIAGNOSIS — Z90722 Acquired absence of ovaries, bilateral: Secondary | ICD-10-CM | POA: Diagnosis not present

## 2023-06-13 DIAGNOSIS — D270 Benign neoplasm of right ovary: Secondary | ICD-10-CM

## 2023-06-13 NOTE — Progress Notes (Signed)
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 Subjective:  Brandi Deleon is a 52 y.o. G0 female who is seen in consultation from Dr. 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  No pelvic pain.  Does have some hot flashes.    Gyn Oncology History Presented to ED 02/13/22 for pain.  Found to have bilateral ovarian cystic masses. Right 7.5 cm and had emergent surgery for torsion.     CA125 = 71.6  CT scan FINDINGS: Lower chest: No acute abnormality.   Hepatobiliary: No focal liver abnormality is seen. No gallstones, gallbladder wall thickening, or biliary dilatation.   Pancreas: Unremarkable. No pancreatic ductal dilatation or surrounding inflammatory changes.   Spleen: Normal in size without focal abnormality.   Adrenals/Urinary Tract: Adrenal glands are unremarkable. Kidneys are normal, without renal calculi, focal lesion, or hydronephrosis. Bladder is unremarkable.   Stomach/Bowel: Stomach is within normal limits. Appendix appears normal. Mild scattered descending and sigmoid colonic diverticula without surrounding inflammatory changes. No evidence of bowel wall thickening, distention, or inflammatory changes.   Vascular/Lymphatic: No significant vascular findings are present. No enlarged abdominal or pelvic lymph nodes.   Reproductive: Complex appearing right adnexal cyst measuring up to approximately 4.6 x 4.6 x 7.5 cm (AP by trans by cc) with mild surrounding fat stranding. Bilobed left simple appearing adnexal cysts measuring 4.9 x 4.5 x 5.1 cm posteriorly and 6.8 x 7.1 x 7.0 cm anteriorly. No significant pelvic ascites. The uterus is present unremarkable.   Other: No abdominal wall hernia or abnormality. No abdominopelvic ascites.    Musculoskeletal: Postsurgical changes after L4-S1 posterior spinal instrumented fusion without complicating features. No acute osseous abnormality.   IMPRESSION: 1. Enlarged (measuring up to 7.5 cm), complex cystic appearance of the right adnexa with mild surrounding fat stranding. Given clinical presentation, these findings are concerning for ovarian torsion. Recommend gynecologic consultation and pelvic ultrasound for further characterization. 2. Bilobed versus 2 adjacent left adnexal cysts measuring up to 5.1 and 7.0 cm. Recommend attention on pelvic ultrasound as in recommended impression point. 3. Diverticulosis, no evidence of diverticulitis.  Underwent Robotic RSO 02/13/22 for torsion.  Findings:  1) enlarged, multilobular right ovary with evidence of torsion about the vascular pedicle.  The ovary with blue-black in appearance with apparent edema in the fallopian tube and ovary 2) left ovarian cyst that was in the deep pelvis. This appears to be more well-defined in shape.  No evidence of torsion.   Pathology A.  OVARY AND FALLOPIAN TUBE, RIGHT; SALPINGO-OOPHORECTOMY:  - OVARIAN SEROUS CYST ADENOFIBROMA WITH FOCAL EPITHELIAL PROLIFERATION,  SEE COMMENT.  - FALLOPIAN TUBE WITH BENIGN PARATUBAL CYST.  - HEMORRHAGIC CORPUS LUTEUM.  - NEGATIVE FOR MALIGNANCY.   Comment:  Sections demonstrate an ovarian serous cyst adenofibroma with focal  areas with serous borderline tumor morphology. Stromal invasion is not  identified. By gross examination, the tissue with serous borderline  tumor morphology involves less than 10% of the ovarian mass. As per  current WHO classification criteria, this lesion is consistent with a  serous cyst adenofibroma with focal epithelial proliferation.   Washings negative.   Used OCPs age [age].  No contraception since then and never got pregnant.  Still has regular periods, no DUB.      On 04/26/22 she underwent robotic TLH, LSO, peritoneal biopsies,  omental  biopsy, washings.  The left adnexa was abnormal with a 4 cm cyst and multiple excrescences. The upper abdomen was normal including omentum, bowel, liver, stomach, and diaphragmatic surfaces. There was no evidence of grossly malignant pelvic or right para-aortic lymph nodes.  There were numerous small nodules less than 5 mm along the posterior aspect of the uterus the right uterosacral ligament and right pelvic peritoneum. Found to have serous cystadenofibroma of ovary with area of serous proliferation in right uterosacral biopsy. Post op visit today.    Pathology DIAGNOSIS:  A. UTEROSACRAL LIGAMENT, RIGHT; BIOPSY:  - ATYPICAL LOW-GRADE SEROUS PROLIFERATION.   B. OVARY, LEFT; OOPHORECTOMY:  - SEROUS CYSTADENOFIBROMA WITH ATYPICAL LOW-GRADE SEROUS PROLIFERATION.  - BACKGROUND BENIGN PHYSIOLOGIC CHANGES.  - FALLOPIAN TUBE WITH NO SIGNIFICANT HISTOPATHOLOGIC CHANGE.  - NEGATIVE FOR MALIGNANCY.   C. UTERUS WITH CERVIX AND LEFT FALLOPIAN TUBES; TOTAL HYSTERECTOMY WITH  LEFT SALPINGECTOMY:  - UTERINE CERVIX:       - BENIGN TRANSFORMATION ZONE.       - NEGATIVE FOR SQUAMOUS INTRAEPITHELIAL LESION.  - ENDOMETRIUM:       - BENIGN ENDOMETRIAL POLYP.       - LATE PROLIFERATIVE PHASE ENDOMETRIUM.       - NEGATIVE FOR ATYPICAL HYPERPLASIA/EIN AND MALIGNANCY.  - MYOMETRIUM:       - ADENOMYOSIS.       - NEGATIVE FOR FEATURES OF MALIGNANCY.  - UTERINE SEROSA:       - NO SIGNIFICANT HISTOPATHOLOGIC CHANGE.   D. SOFT TISSUE, RIGHT PELVIC; BIOPSY:  - BENIGN MESOTHELIAL LINED FIBROADIPOSE TISSUE.  - NEGATIVE FOR ATYPIA AND MALIGNANCY.   E. SOFT TISSUE, RIGHT INFUNDIBULOPELVIC LIGAMENT; BIOPSY:  - BENIGN FIBROADIPOSE AND FIBROMUSCULAR TISSUE.  - NEGATIVE FOR ATYPIA AND MALIGNANCY.   F. SOFT TISSUE, RIGHT PERICOLIC; BIOPSY:  - BENIGN MESOTHELIAL LINED FIBROADIPOSE TISSUE.  - NEGATIVE FOR ATYPIA AND MALIGNANCY.   G. SOFT TISSUE, RIGHT INFERIOR PELVIC; BIOPSY:  - BENIGN FIBROUS AND  FIBROADIPOSE TISSUE.  - NEGATIVE FOR ATYPIA AND MALIGNANCY.   H. SOFT TISSUE, ANTERIOR CUL-DE-SAC; BIOPSY:  - BENIGN MESOTHELIAL LINED FIBROADIPOSE AND FIBROMUSCULAR TISSUE.  - NEGATIVE FOR ATYPIA AND MALIGNANCY.   I. SOFT TISSUE, LEFT PELVIC; BIOPSY:  - HEAVILY CAUTERIZED MESOTHELIAL LINED FIBROUS TISSUE.  - NO DEFINITE EVIDENCE OF ATYPIA OR MALIGNANCY   J. SOFT TISSUE, LEFT PERICOLIC; BIOPSY:  - BENIGN MESOTHELIAL LINED FIBROADIPOSE TISSUE WITH SIGNIFICANT CAUTERY  ARTIFACT.  - NO DEFINITE EVIDENCE OF ATYPIA OR MALIGNANCY.   K. OMENTUM; OMENTECTOMY:  - PREDOMINANTLY BENIGN FIBROADIPOSE TISSUE, WITH FOCAL REACTIVE  MESOTHELIAL HYPERPLASIA.   Comment:  The entire ovarian cyst wall was submitted for histologic evaluation.  Multiple additional deeper recut levels were examined for parts I and J.  In addition, the hysterectomy specimen (part C) was re-examined for  potential serosal nodules, and one minute focus was submitted for  histologic evaluation, without identification of significant  histopathologic change.   Problem List: Patient Active Problem List   Diagnosis Date Noted   Left ovarian cyst 04/26/2022   Serous cystadenofibroma, right 03/15/2022   Bilateral ovarian cysts 02/13/2022   Ovarian torsion 02/13/2022    Past Medical History: Past Medical History:  Diagnosis Date   Hypothyroid     Past Surgical History: Past Surgical History:  Procedure Laterality Date   CYSTOSCOPY  02/13/2022   Procedure: CYSTOSCOPY;  Surgeon: Conard Novak, MD;  Location: [facility];  Service: Gynecology;;   CYSTOSCOPY  04/26/2022   Procedure: Bluford Kaufmann;  Surgeon: Jean Rosenthal,  Mila Homer, MD;  Location: [facility];  Service: Gynecology;;   LUMBAR FUSION  2003   OVARIAN CYST REMOVAL Right 02/13/2022   Procedure: OVARIAN CYSTECTOMY;  Surgeon: Conard Novak, MD;  Location: [facility];  Service: Gynecology;  Laterality: Right;   ROBOTIC ASSISTED BILATERAL SALPINGO OOPHERECTOMY Left  04/26/2022   Procedure: XI ROBOTIC ASSISTED LEFT SALPINGO OOPHORECTOMY, PARTIAL OMENECTOMY, PERITONEAL BIOPSIES;  Surgeon: Conard Novak, MD;  Location: [facility];  Service: Gynecology;  Laterality: Left;   ROBOTIC ASSISTED LAPAROSCOPIC HYSTERECTOMY AND SALPINGECTOMY N/A 04/26/2022   Procedure: XI ROBOTIC ASSISTED LAPAROSCOPIC HYSTERECTOMY;  Surgeon: Conard Novak, MD;  Location: [facility];  Service: Gynecology;  Laterality: N/A;   ROBOTIC ASSISTED SALPINGO OOPHERECTOMY Right 02/13/2022   Procedure: ROBOTIC ASSISTED SALPINGO OOPHORECTOMY;  Surgeon: Conard Novak, MD;  Location: [facility];  Service: Gynecology;  Laterality: Right;       OB History:  OB History  Gravida Para Term Preterm AB Living  0 0 0 0 0 0  SAB IAB Ectopic Multiple Live Births  0 0 0 0 0    Family History: Family History  Problem Relation Age of Onset   Cancer - Lung Father    Ovarian cysts Sister    Cancer Maternal Grandmother    Cancer Paternal Grandmother    Breast cancer Neg Hx     Social History: Social History   Socioeconomic History   Marital status: Widowed    Spouse name: Not on file   Number of children: Not on file   Years of education: Not on file   Highest education level: Not on file  Occupational History   Not on file  Tobacco Use   Smoking status: Never   Smokeless tobacco: Never  Vaping Use   Vaping status: Never Used  Substance and Sexual Activity   Alcohol use: Not Currently    Comment: 1 beer weekly   Drug use: Not Currently    Types: Marijuana   Sexual activity: Yes    Partners: Male    Birth control/protection: None  Other Topics Concern   Not on file  Social History Narrative   Not on file   Social Determinants of Health   Financial Resource Strain: Low Risk  (02/01/2023)   Received from [facility]   Overall Financial Resource Strain (CARDIA)    Difficulty of Paying Living Expenses: Not hard at all  Food Insecurity: No Food Insecurity  (02/01/2023)   Received from [facility]   Hunger Vital Sign    Worried About Running Out of Food in the Last Year: Never true    Ran Out of Food in the Last Year: Never true  Transportation Needs: No Transportation Needs (02/01/2023)   Received from [facility] - Transportation    In the past 12 months, has lack of transportation kept you from medical appointments or from getting medications?: No    Lack of Transportation (Non-Medical): No  Physical Activity: Not on file  Stress: Not on file  Social Connections: Not on file  Intimate Partner Violence: Not on file    Allergies: No Known Allergies  Current Medications: Current Outpatient Medications  Medication Sig Dispense Refill   buPROPion (WELLBUTRIN XL) 150 MG 24 hr tablet Take 150 mg by mouth daily.     ibuprofen (ADVIL) 600 MG tablet Take 1 tablet (600 mg total) by mouth every 6 (six) hours. 30 tablet 0   levothyroxine (SYNTHROID) 88  MCG tablet Take 88 mcg by mouth daily before breakfast.     Multiple Vitamin (MULTIVITAMIN) tablet Take 1 tablet by mouth daily.     No current facility-administered medications for this visit.    Review of Systems General: negative for, fevers, chills, fatigue, changes in sleep, changes in weight or appetite Skin: negative for changes in color, texture, moles or lesions Eyes: negative for, changes in vision, pain, diplopia HEENT: negative for, change in hearing, pain, discharge, tinnitus, vertigo, voice changes, sore throat, neck masses Breasts: negative for breast lumps Pulmonary: negative for, dyspnea, orthopnea, productive cough Cardiac: negative for, palpitations, syncope, pain, discomfort, pressure Gastrointestinal: negative for, dysphagia, nausea, vomiting, jaundice, pain, constipation, diarrhea, hematemesis, hematochezia Genitourinary/Sexual: negative for, dysuria, discharge, hesitancy, nocturia, retention, stones, infections, STD's,  incontinence Musculoskeletal: negative for, pain, stiffness, swelling, range of motion limitation Hematology: negative for, easy bruising, bleeding Neurologic/Psych: negative for, headaches, seizures, paralysis, weakness, tremor, change in gait, change in sensation, mood swings, depression, anxiety, change in memory  Objective:  Physical Examination:  LMP 03/11/2022 (Approximate)    ECOG Performance Status: 1 - Symptomatic but completely ambulatory  General appearance: alert, cooperative, and appears stated age HEENT: normal Lungs: clear to A and P Heart: regular rate and rhythm Abdomen: soft, non-tender, without masses or organomegaly, no hernias, and well healed incisions Extr: normal  Pelvic: Chaperoned by NA EGBUS/Vagina: normal Bimanual/Rectal: no masses  Assessment:  Brandi Deleon is a 52 y.o. G0 premenopausal female diagnosed with 7.5 cm right ovarian serous cystadenofibroma with 10% serous borderline component at emergency surgery for torsion 02/13/22.  CA125 71.6. Washings negative.  Sections demonstrate an ovarian serous cyst adenofibroma with focal areas with serous borderline tumor morphology. Stromal invasion is not identified. By gross examination, the tissue with serous borderline tumor morphology involves less than 10% of the ovarian mass.  As per current WHO classification criteria, this lesion is consistent with a serous cyst adenofibroma with focal epithelial proliferation.   Large bilobed cyst up to 7 cm noted in left ovary on follow up CT scan.  On 04/26/22 she underwent TLH, LSO, peritoneal biopsies, omental biopsy, washings.  The left adnexa was abnormal with a 4 cm cyst and multiple excrescences. There were numerous small nodules less than 5 mm along the posterior aspect of the uterus the right uterosacral ligament and right pelvic peritoneum.  Found to have serous cystadenofibroma of ovary with area of serous proliferation in right uterosacral biopsy.  NED    Medical co-morbidities complicating care: BMI 33. Plan:   Problem List Items Addressed This Visit       Endocrine   Serous cystadenofibroma, right - Primary    Discussed that the risk of recurrence should be very low and recommended surveillance. Can alternate visits q 6 months with CA125 since this was elevated at initial diagnosis with Dr Jean Rosenthal and Gyn Onc.  The patient's diagnosis, an outline of the further diagnostic and laboratory studies which will be required, the recommendation, and alternatives were discussed.  All questions were answered to the patient's satisfaction.    The patient was advised to call back or seek an in-person evaluation if the symptoms worsen or if the condition fails to improve as anticipated.  Leida Lauth, MD  CC:  Dial, Robinson B, FNP [address] [place],  [place] [address] [phone]

## 2023-06-13 NOTE — Patient Instructions (Signed)
6 months w/  Dr. Jean Rosenthal

## 2023-06-14 LAB — CA 125: Cancer Antigen (CA) 125: 15.7 U/mL (ref 0.0–38.1)

## 2023-07-11 ENCOUNTER — Other Ambulatory Visit: Payer: Self-pay

## 2023-07-11 DIAGNOSIS — N83201 Unspecified ovarian cyst, right side: Secondary | ICD-10-CM

## 2023-07-20 ENCOUNTER — Encounter: Payer: Self-pay | Admitting: *Deleted

## 2023-08-06 ENCOUNTER — Encounter: Payer: Self-pay | Admitting: Certified Registered"

## 2023-08-06 ENCOUNTER — Ambulatory Visit: Admission: RE | Admit: 2023-08-06 | Payer: Commercial Managed Care - PPO | Source: Home / Self Care

## 2023-08-06 HISTORY — DX: Anxiety disorder, unspecified: F41.9

## 2023-08-06 HISTORY — DX: Migraine, unspecified, not intractable, without status migrainosus: G43.909

## 2023-08-06 HISTORY — DX: Headache, unspecified: R51.9

## 2023-08-06 SURGERY — COLONOSCOPY WITH PROPOFOL
Anesthesia: General

## 2024-02-22 ENCOUNTER — Other Ambulatory Visit: Payer: Self-pay | Admitting: Internal Medicine

## 2024-02-22 DIAGNOSIS — R7989 Other specified abnormal findings of blood chemistry: Secondary | ICD-10-CM

## 2024-02-28 ENCOUNTER — Ambulatory Visit

## 2024-03-15 LAB — COLOGUARD: COLOGUARD: NEGATIVE

## 2024-05-06 IMAGING — CT CT ABD-PELV W/ CM
2 of 4 series · 15 of 46 positions shown, 17 images · IV contrast (APPLIED)
Comparison: None Available.

CLINICAL DATA: 50-year-old female with history of right lower
quadrant abdominal pain.

EXAM:
CT ABDOMEN AND PELVIS WITH CONTRAST
TECHNIQUE: Multidetector CT imaging of the abdomen and pelvis was performed
using the standard protocol following bolus administration of
intravenous contrast.

[Series 3: abdomen 5.0 · axial · 0.91mm/px · z∈[+958,+1423]mm · 12 of 107 slices shown, 14 images]
[im 7/107  soft-tissue]
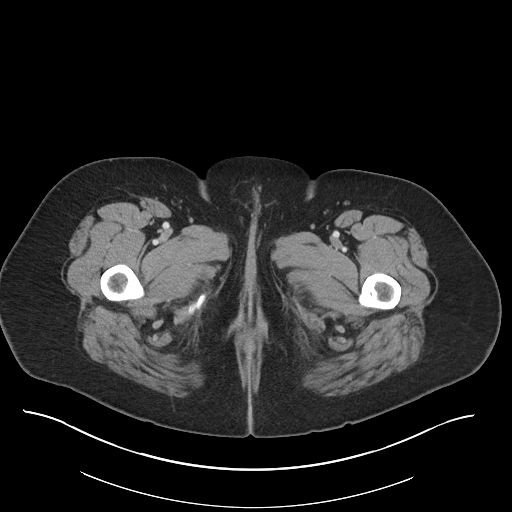
[im 7/107  bone]
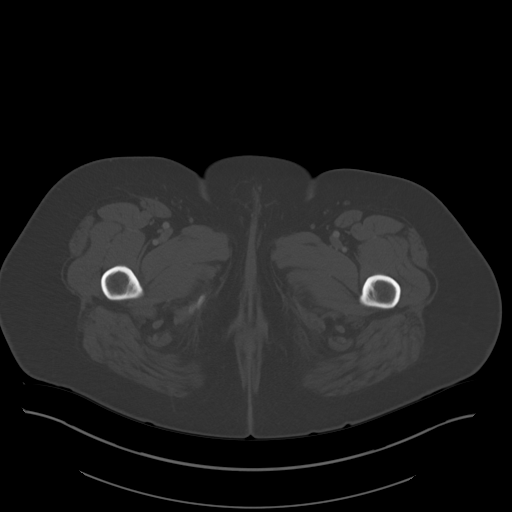
[im 19/107  soft-tissue]
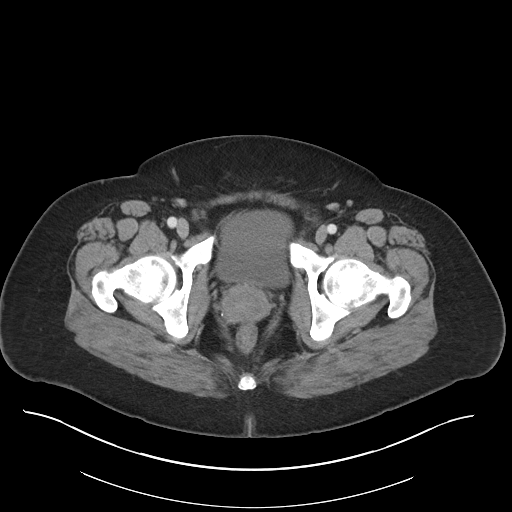
[im 25/107  soft-tissue]
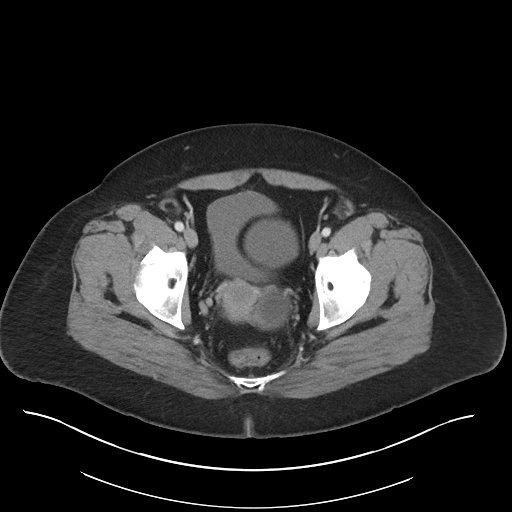
[im 32/107  soft-tissue]
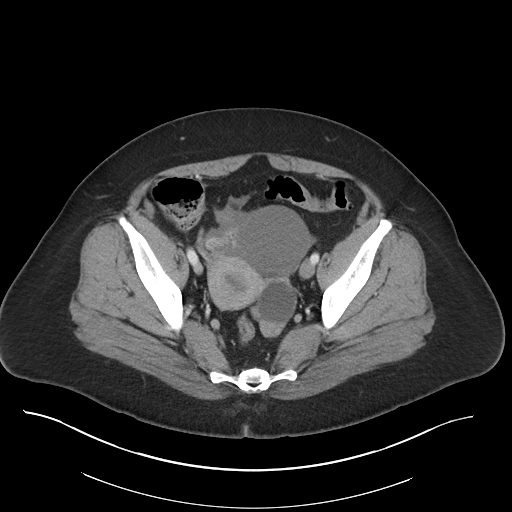
[im 44/107  soft-tissue]
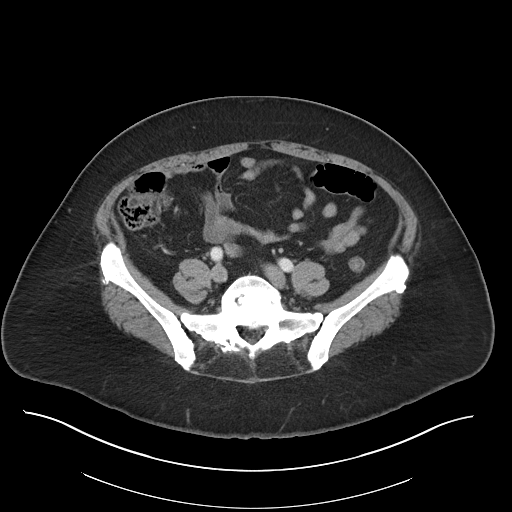
[im 50/107  soft-tissue]
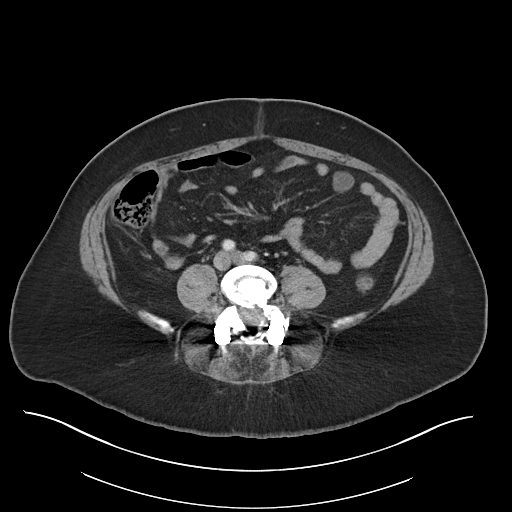
[im 57/107  soft-tissue]
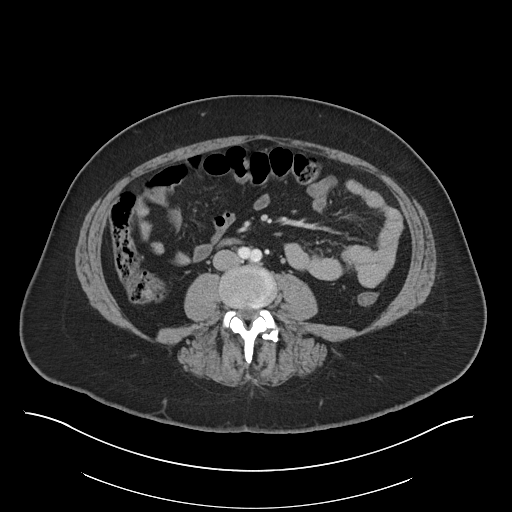
[im 69/107  soft-tissue]
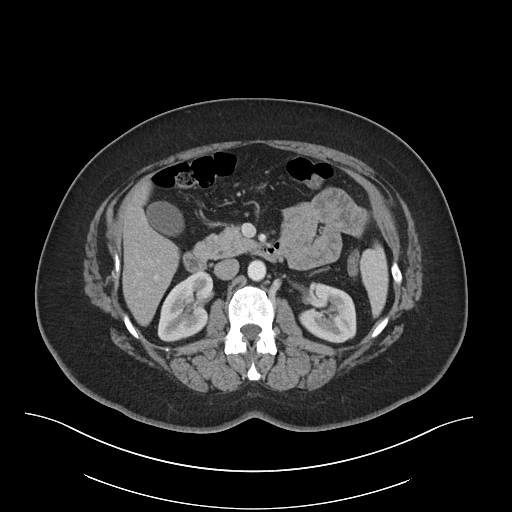
[im 75/107  soft-tissue]
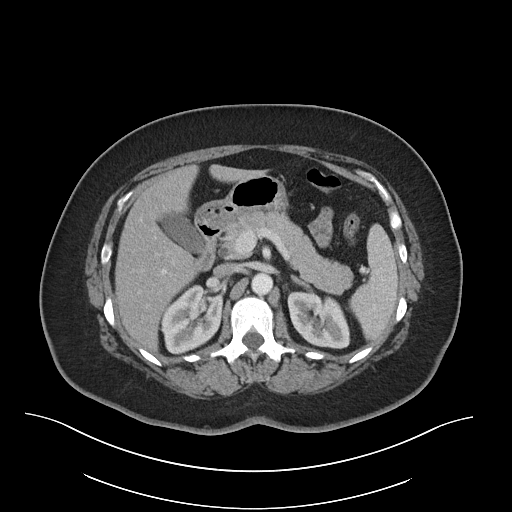
[im 75/107  bone]
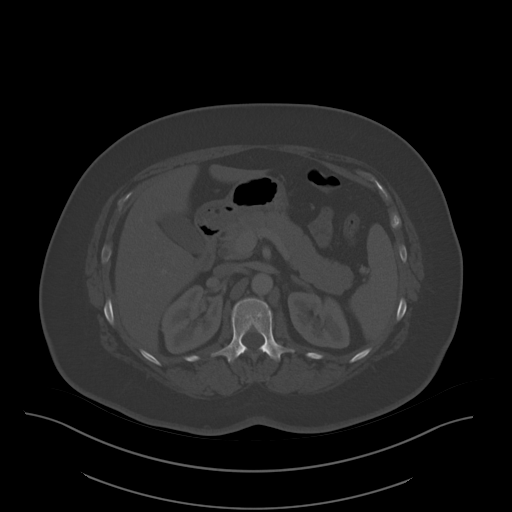
[im 82/107  soft-tissue]
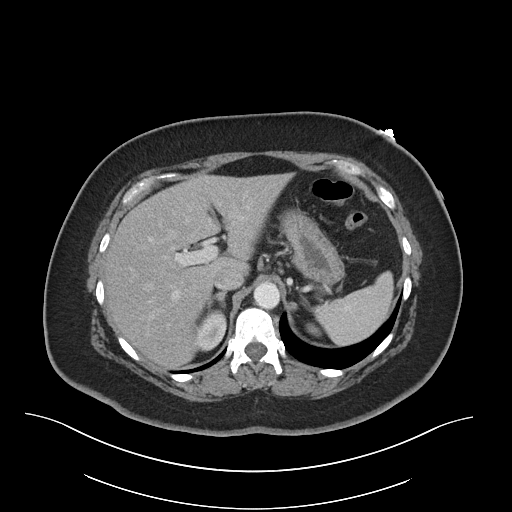
[im 94/107  soft-tissue]
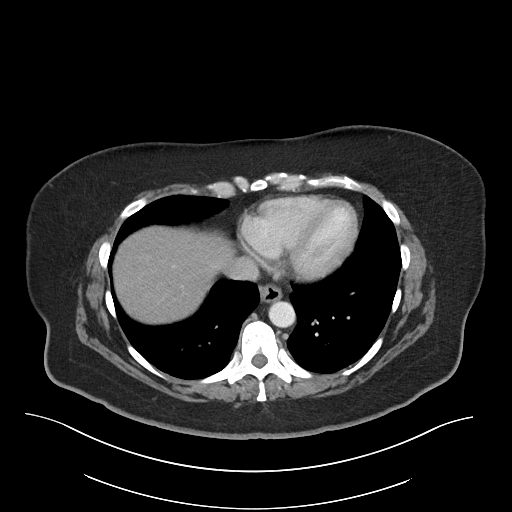
[im 100/107  soft-tissue]
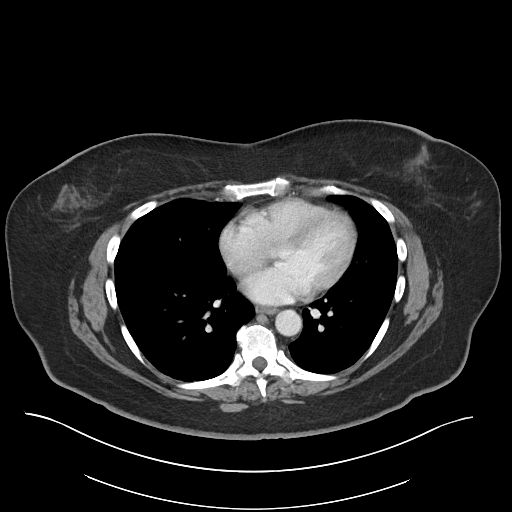

[Series 5: abdomen 3.0 mpr cor · coronal · 0.91mm/px · 3 of 105 slices shown]
[im 35/105  soft-tissue]
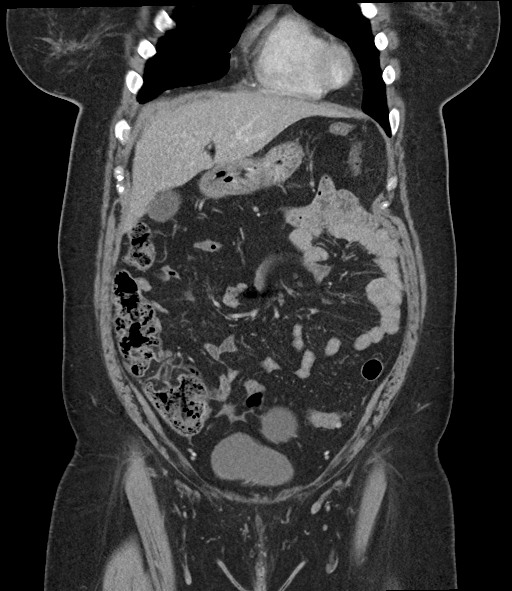
[im 47/105  soft-tissue]
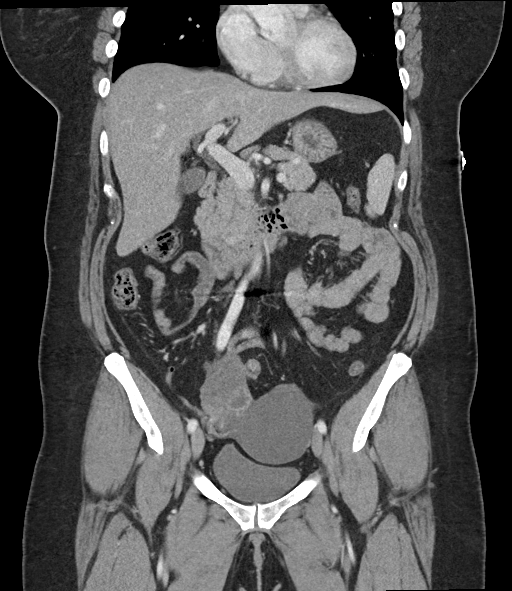
[im 58/105  soft-tissue]
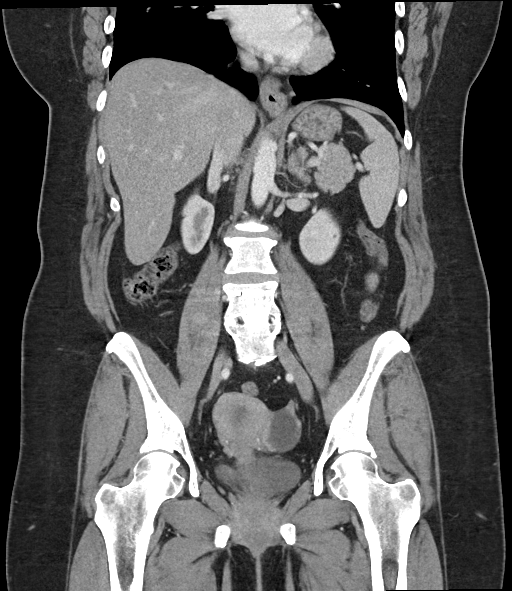

[15 of 46 positions shown; findings below may reference images not displayed]

RADIATION DOSE REDUCTION: This exam was performed according to the
departmental dose-optimization program which includes automated
exposure control, adjustment of the mA and/or kV according to
patient size and/or use of iterative reconstruction technique.

CONTRAST:  100mL OMNIPAQUE IOHEXOL 300 MG/ML  SOLN
FINDINGS: Lower chest: No acute abnormality.

Hepatobiliary: No focal liver abnormality is seen. No gallstones,
gallbladder wall thickening, or biliary dilatation.

Pancreas: Unremarkable. No pancreatic ductal dilatation or
surrounding inflammatory changes.

Spleen: Normal in size without focal abnormality.

Adrenals/Urinary Tract: Adrenal glands are unremarkable. Kidneys are
normal, without renal calculi, focal lesion, or hydronephrosis.
Bladder is unremarkable.

Stomach/Bowel: Stomach is within normal limits. Appendix appears
normal. Mild scattered descending and sigmoid colonic diverticula
without surrounding inflammatory changes. No evidence of bowel wall
thickening, distention, or inflammatory changes.

Vascular/Lymphatic: No significant vascular findings are present. No
enlarged abdominal or pelvic lymph nodes.

Reproductive: Complex appearing right adnexal cyst measuring up to
approximately 4.6 x 4.6 x 7.5 cm (AP by trans by cc) with mild
surrounding fat stranding. Bilobed left simple appearing adnexal
cysts measuring 4.9 x 4.5 x 5.1 cm posteriorly and 6.8 x 7.1 x
cm anteriorly. No significant pelvic ascites. The uterus is present
unremarkable.

Other: No abdominal wall hernia or abnormality. No abdominopelvic
ascites.

Musculoskeletal: Postsurgical changes after L4-S1 posterior spinal
instrumented fusion without complicating features. No acute osseous
abnormality.
IMPRESSION: 1. Enlarged (measuring up to 7.5 cm), complex cystic appearance of
the right adnexa with mild surrounding fat stranding. Given clinical
presentation, these findings are concerning for ovarian torsion.
Recommend gynecologic consultation and pelvic ultrasound for further
characterization.
2. Bilobed versus 2 adjacent left adnexal cysts measuring up to
and 7.0 cm. Recommend attention on pelvic ultrasound as in
recommended impression point.
3. Diverticulosis, no evidence of diverticulitis.

These results were called by telephone at the time of interpretation
on 02/13/2022 at [DATE] to provider ESAMBE WADAWA , who verbally
acknowledged these results.

## 2024-05-16 ENCOUNTER — Other Ambulatory Visit: Payer: Self-pay | Admitting: Internal Medicine

## 2024-05-16 ENCOUNTER — Encounter: Payer: Self-pay | Admitting: Internal Medicine

## 2024-05-16 DIAGNOSIS — Z1231 Encounter for screening mammogram for malignant neoplasm of breast: Secondary | ICD-10-CM

## 2024-06-04 ENCOUNTER — Ambulatory Visit
Admission: RE | Admit: 2024-06-04 | Discharge: 2024-06-04 | Disposition: A | Discharge status: Home / Self Care | Source: Ambulatory Visit | Attending: Internal Medicine | Admitting: Internal Medicine

## 2024-06-04 DIAGNOSIS — Z1231 Encounter for screening mammogram for malignant neoplasm of breast: Secondary | ICD-10-CM | POA: Diagnosis present

## 2024-06-17 NOTE — Progress Notes (Unsigned)
 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 Subjective:  Brandi Deleon is a 53 y.o. G0 female who is seen in consultation from Dr. 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 She returns to clinic for surveillance.   She was seen by Dr. Leonce 12/10/23. She complained of hot flashes, joint pain, weight gain, and dyspareunia at that time. Was started on estradiol intravaginally and transdermally after consultation with Dr Elby. Symptoms much better.  No new complaints   CA 125 has been followed 12/10/23  16.1 06/13/23 15.7 12/06/22 14.8 02/13/22  71.6    HE4- 55.7 1024   15.7   Gyn Oncology History Presented to ED 02/13/22 for pain.  Found to have bilateral ovarian cystic masses. Right 7.5 cm and had emergent surgery for torsion.     CA125 = 71.6  CT scan FINDINGS: Lower chest: No acute abnormality.   Hepatobiliary: No focal liver abnormality is seen. No gallstones, gallbladder wall thickening, or biliary dilatation.   Pancreas: Unremarkable. No pancreatic ductal dilatation or surrounding inflammatory changes.   Spleen: Normal in size without focal abnormality.   Adrenals/Urinary Tract: Adrenal glands are unremarkable. Kidneys are normal, without renal calculi, focal lesion, or hydronephrosis. Bladder is unremarkable.   Stomach/Bowel: Stomach is within normal limits. Appendix appears normal. Mild scattered descending and sigmoid colonic diverticula without surrounding inflammatory changes. No evidence of bowel wall thickening, distention, or inflammatory changes.   Vascular/Lymphatic: No significant vascular findings are  present. No enlarged abdominal or pelvic lymph nodes.   Reproductive: Complex appearing right adnexal cyst measuring up to approximately 4.6 x 4.6 x 7.5 cm (AP by trans by cc) with mild surrounding fat stranding. Bilobed left simple appearing adnexal cysts measuring 4.9 x 4.5 x 5.1 cm posteriorly and 6.8 x 7.1 x 7.0 cm anteriorly. No significant pelvic ascites. The uterus is present unremarkable.   Other: No abdominal wall hernia or abnormality. No abdominopelvic ascites.   Musculoskeletal: Postsurgical changes after L4-S1 posterior spinal instrumented fusion without complicating features. No acute osseous abnormality.   IMPRESSION: 1. Enlarged (measuring up to 7.5 cm), complex cystic appearance of the right adnexa with mild surrounding fat stranding. Given clinical presentation, these findings are concerning for ovarian torsion. Recommend gynecologic consultation and pelvic ultrasound for further characterization. 2. Bilobed versus 2 adjacent left adnexal cysts measuring up to 5.1 and 7.0 cm. Recommend attention on pelvic ultrasound as in recommended impression point. 3. Diverticulosis, no evidence of diverticulitis.  Underwent Robotic RSO 02/13/22 for torsion.  Findings:  1) enlarged, multilobular right ovary with evidence of torsion about the vascular pedicle.  The ovary with blue-black in appearance with apparent edema in the fallopian tube and ovary 2) left ovarian cyst that was in the deep pelvis. This appears to be more well-defined in shape.  No evidence of torsion.   Pathology A.  OVARY AND FALLOPIAN TUBE, RIGHT; SALPINGO-OOPHORECTOMY:  - OVARIAN SEROUS CYST ADENOFIBROMA WITH FOCAL EPITHELIAL PROLIFERATION,  SEE COMMENT.  - FALLOPIAN TUBE WITH BENIGN PARATUBAL CYST.  - HEMORRHAGIC CORPUS LUTEUM.  - NEGATIVE FOR MALIGNANCY.   Comment:  Sections demonstrate an ovarian serous cyst adenofibroma with focal areas with serous borderline tumor morphology. Stromal invasion is not   identified. By  gross examination, the tissue with serous borderline  tumor morphology involves less than 10% of the ovarian mass. As per  current WHO classification criteria, this lesion is consistent with a  serous cyst adenofibroma with focal epithelial proliferation.   Washings negative.   Used OCPs age [age].  No contraception since then and never got pregnant.  Still has regular periods, no DUB.      On 04/26/22 she underwent robotic TLH, LSO, peritoneal biopsies, omental biopsy, washings with Dr Leonce and Dr Elby at [facility].  The left adnexa was abnormal with a 4 cm cyst and multiple excrescences. The upper abdomen was normal including omentum, bowel, liver, stomach, and diaphragmatic surfaces. There was no evidence of grossly malignant pelvic or right para-aortic lymph nodes.  There were numerous small nodules less than 5 mm along the posterior aspect of the uterus the right uterosacral ligament and right pelvic peritoneum. Found to have serous cystadenofibroma of ovary with area of serous proliferation in right uterosacral biopsy. Post op visit today.    Pathology DIAGNOSIS:  A. UTEROSACRAL LIGAMENT, RIGHT; BIOPSY:  - ATYPICAL LOW-GRADE SEROUS PROLIFERATION.   B. OVARY, LEFT; OOPHORECTOMY:  - SEROUS CYSTADENOFIBROMA WITH ATYPICAL LOW-GRADE SEROUS PROLIFERATION.  - BACKGROUND BENIGN PHYSIOLOGIC CHANGES.  - FALLOPIAN TUBE WITH NO SIGNIFICANT HISTOPATHOLOGIC CHANGE.  - NEGATIVE FOR MALIGNANCY.   C. UTERUS WITH CERVIX AND LEFT FALLOPIAN TUBES; TOTAL HYSTERECTOMY WITH  LEFT SALPINGECTOMY:  - UTERINE CERVIX:       - BENIGN TRANSFORMATION ZONE.       - NEGATIVE FOR SQUAMOUS INTRAEPITHELIAL LESION.  - ENDOMETRIUM:       - BENIGN ENDOMETRIAL POLYP.       - LATE PROLIFERATIVE PHASE ENDOMETRIUM.       - NEGATIVE FOR ATYPICAL HYPERPLASIA/EIN AND MALIGNANCY.  - MYOMETRIUM:       - ADENOMYOSIS.       - NEGATIVE FOR FEATURES OF MALIGNANCY.  - UTERINE SEROSA:       - NO SIGNIFICANT  HISTOPATHOLOGIC CHANGE.   D. SOFT TISSUE, RIGHT PELVIC; BIOPSY:  - BENIGN MESOTHELIAL LINED FIBROADIPOSE TISSUE.  - NEGATIVE FOR ATYPIA AND MALIGNANCY.   E. SOFT TISSUE, RIGHT INFUNDIBULOPELVIC LIGAMENT; BIOPSY:  - BENIGN FIBROADIPOSE AND FIBROMUSCULAR TISSUE.  - NEGATIVE FOR ATYPIA AND MALIGNANCY.   F. SOFT TISSUE, RIGHT PERICOLIC; BIOPSY:  - BENIGN MESOTHELIAL LINED FIBROADIPOSE TISSUE.  - NEGATIVE FOR ATYPIA AND MALIGNANCY.   G. SOFT TISSUE, RIGHT INFERIOR PELVIC; BIOPSY:  - BENIGN FIBROUS AND FIBROADIPOSE TISSUE.  - NEGATIVE FOR ATYPIA AND MALIGNANCY.   H. SOFT TISSUE, ANTERIOR CUL-DE-SAC; BIOPSY:  - BENIGN MESOTHELIAL LINED FIBROADIPOSE AND FIBROMUSCULAR TISSUE.  - NEGATIVE FOR ATYPIA AND MALIGNANCY.   I. SOFT TISSUE, LEFT PELVIC; BIOPSY:  - HEAVILY CAUTERIZED MESOTHELIAL LINED FIBROUS TISSUE.  - NO DEFINITE EVIDENCE OF ATYPIA OR MALIGNANCY   J. SOFT TISSUE, LEFT PERICOLIC; BIOPSY:  - BENIGN MESOTHELIAL LINED FIBROADIPOSE TISSUE WITH SIGNIFICANT CAUTERY  ARTIFACT.  - NO DEFINITE EVIDENCE OF ATYPIA OR MALIGNANCY.   K. OMENTUM; OMENTECTOMY:  - PREDOMINANTLY BENIGN FIBROADIPOSE TISSUE, WITH FOCAL REACTIVE  MESOTHELIAL HYPERPLASIA.   Comment:  The entire ovarian cyst wall was submitted for histologic evaluation. Multiple additional deeper recut levels were examined for parts I and J. In addition, the hysterectomy specimen (part C) was re-examined for  potential serosal nodules, and one minute focus was submitted for histologic evaluation, without identification of significant histopathologic change.   Problem List: Patient Active Problem List   Diagnosis Date Noted  Left ovarian cyst 04/26/2022   Serous cystadenofibroma, right 03/15/2022   Bilateral ovarian cysts 02/13/2022   Ovarian torsion 02/13/2022    Past Medical History: Past Medical History:  Diagnosis Date   Anxiety    Headache    Hypothyroid    Migraine     Past Surgical History: Past Surgical  History:  Procedure Laterality Date   BACK SURGERY     CYSTOSCOPY  02/13/2022   Procedure: CYSTOSCOPY;  Surgeon: Leonce Garnette BIRCH, MD;  Location: [facility];  Service: Gynecology;;   PHYLLIS  04/26/2022   Procedure: CYSTOSCOPY;  Surgeon: Leonce Garnette BIRCH, MD;  Location: [facility];  Service: Gynecology;;   LUMBAR FUSION  2003   OVARIAN CYST REMOVAL Right 02/13/2022   Procedure: OVARIAN CYSTECTOMY;  Surgeon: Leonce Garnette BIRCH, MD;  Location: [facility];  Service: Gynecology;  Laterality: Right;   ROBOTIC ASSISTED BILATERAL SALPINGO OOPHERECTOMY Left 04/26/2022   Procedure: XI ROBOTIC ASSISTED LEFT SALPINGO OOPHORECTOMY, PARTIAL OMENECTOMY, PERITONEAL BIOPSIES;  Surgeon: Leonce Garnette BIRCH, MD;  Location: [facility];  Service: Gynecology;  Laterality: Left;   ROBOTIC ASSISTED LAPAROSCOPIC HYSTERECTOMY AND SALPINGECTOMY N/A 04/26/2022   Procedure: XI ROBOTIC ASSISTED LAPAROSCOPIC HYSTERECTOMY;  Surgeon: Leonce Garnette BIRCH, MD;  Location: [facility];  Service: Gynecology;  Laterality: N/A;   ROBOTIC ASSISTED SALPINGO OOPHERECTOMY Right 02/13/2022   Procedure: ROBOTIC ASSISTED SALPINGO OOPHORECTOMY;  Surgeon: Leonce Garnette BIRCH, MD;  Location: [facility];  Service: Gynecology;  Laterality: Right;       OB History:  OB History  Gravida Para Term Preterm AB Living  0 0 0 0 0 0  SAB IAB Ectopic Multiple Live Births  0 0 0 0 0    Family History: Family History  Problem Relation Age of Onset   Cancer - Lung Father    Ovarian cysts Sister    Cancer Maternal Grandmother    Cancer Paternal Grandmother    Breast cancer Neg Hx     Social History: Social History   Socioeconomic History   Marital status: Widowed    Spouse name: Not on file   Number of children: Not on file   Years of education: Not on file   Highest education level: Not on file  Occupational History   Not on file  Tobacco Use   Smoking status: Never   Smokeless tobacco: Never  Vaping Use   Vaping status: Never Used   Substance and Sexual Activity   Alcohol use: Not Currently    Comment: 1 beer weekly   Drug use: Not Currently    Types: Marijuana   Sexual activity: Yes    Partners: Male    Birth control/protection: None  Other Topics Concern   Not on file  Social History Narrative   Not on file   Social Drivers of Health   Financial Resource Strain: Low Risk  (02/20/2024)   Received from [facility]   Overall Financial Resource Strain (CARDIA)    Difficulty of Paying Living Expenses: Not hard at all  Food Insecurity: No Food Insecurity (02/20/2024)   Received from [facility]   Hunger Vital Sign    Within the past 12 months, you worried that your food would run out before you got the money to buy more.: Never true    Within the past 12 months, the food you bought just didn't last and you didn't have money to get more.: Never true  Transportation Needs: No Transportation Needs (02/20/2024)   Received from [facility]   PRAPARE - Transportation    In the past 12 months, has lack of transportation kept you from medical appointments or from getting medications?: No    Lack of Transportation (Non-Medical): No  Physical Activity: Not on file  Stress: Not on file  Social Connections: Not on file  Intimate Partner Violence: Not on file    Allergies: Not on File  Current Medications: Current Outpatient Medications  Medication Sig Dispense Refill   buPROPion (WELLBUTRIN XL) 150 MG 24 hr tablet Take 150 mg by mouth daily.     ibuprofen  (ADVIL ) 600 MG tablet Take 1 tablet (600 mg total) by mouth every 6 (six) hours. 30 tablet 0   levothyroxine (SYNTHROID) 88 MCG tablet Take 88 mcg by mouth daily before breakfast.     loratadine (CLARITIN) 10 MG tablet Take 10 mg by mouth daily.     Multiple Vitamin (MULTIVITAMIN) tablet Take 1 tablet by mouth daily.     No current facility-administered medications for this visit.    Review of  Systems General:  no complaints Skin: no complaints Eyes: no complaints HEENT: no complaints Breasts: no complaints Pulmonary: no complaints Cardiac: no complaints Gastrointestinal: no complaints Genitourinary/Sexual: no complaints Ob/Gyn: no complaints Musculoskeletal: no complaints Hematology: no complaints Neurologic/Psych: no complaints   Objective:  Physical Examination:  LMP 03/11/2022 (Approximate)    ECOG Performance Status: 1 - Symptomatic but completely ambulatory  GENERAL: Patient is a well appearing female in no acute distress HEENT:  Sclera clear. Anicteric NODES:  Negative axillary, supraclavicular, inguinal lymph node survery LUNGS:  Clear to auscultation bilaterally.   HEART:  Regular rate and rhythm.  ABDOMEN:  Soft, nontender.  No hernias, incisions well healed. No masses or ascites EXTREMITIES:  No peripheral edema. Atraumatic. No cyanosis SKIN:  Clear with no obvious rashes or skin changes.  NEURO:  Nonfocal. Well oriented.  Appropriate affect.  Pelvic: Chaperoned by NA EGBUS/Vagina: normal, well estrogenized Bimanual: no masses  Assessment:  Brandi Deleon is a 53 y.o. G0 premenopausal female diagnosed with 7.5 cm right ovarian serous cystadenofibroma with 10% serous borderline component at emergency surgery for torsion 02/13/22.  CA125 71.6. Washings negative.  Sections demonstrate an ovarian serous cyst adenofibroma with focal areas with serous borderline tumor morphology. Stromal invasion is not identified. By gross examination, the tissue with serous borderline tumor morphology involves less than 10% of the ovarian mass.  As per current WHO classification criteria, this lesion is consistent with a serous cyst adenofibroma with focal epithelial proliferation.   Large bilobed cyst up to 7 cm noted in left ovary on follow up CT scan.  On 04/26/22 she underwent TLH, LSO, peritoneal biopsies, omental biopsy, washings.  The left adnexa was abnormal with  a 4 cm cyst and multiple excrescences. There were numerous small nodules less than 5 mm along the posterior aspect of the uterus the right uterosacral ligament and right pelvic peritoneum.  Found to have serous cystadenofibroma of ovary with area of serous proliferation in right uterosacral biopsy.  NED   Medical co-morbidities complicating care: BMI 33. Plan:   Problem List Items Addressed This Visit       Endocrine   Serous cystadenofibroma, right - Primary    CA125 pending today.  Discussed that the risk of recurrence should be very low and recommended surveillance. Can alternate visits q 6 months with CA125 since this was elevated at initial diagnosis with Dr Leonce and Gyn Onc.  The patient's diagnosis, an outline  of the further diagnostic and laboratory studies which will be required, the recommendation, and alternatives were discussed.  All questions were answered to the patient's satisfaction.    The patient was advised to call back or seek an in-person evaluation if the symptoms worsen or if the condition fails to improve as anticipated.  Tinnie Dawn, DNP, AGNP-C, AOCNP [facility] [phone] (clinic)  I personally had a face to face interaction and evaluated the patient jointly with the NP, Ms. Tinnie Dawn.  I have reviewed her history and available records and have performed the key portions of the physical exam including lymph node survey, abdominal exam, pelvic exam with my findings confirming those documented above by the APP.  I have discussed the case with the APP and the patient.  I agree with the above documentation, assessment and plan which was fully formulated by me.  Counseling was completed by me.   I personally saw the patient and performed a substantive portion of this encounter in conjunction with the listed APP as documented above.  Prentice Agent, MD   CC:  Dial, [place] B, FNP [address] [place],  [place]  [address] [phone]  Dr Leonce

## 2024-06-18 ENCOUNTER — Inpatient Hospital Stay (HOSPITAL_BASED_OUTPATIENT_CLINIC_OR_DEPARTMENT_OTHER): Payer: Commercial Managed Care - PPO | Admitting: Obstetrics and Gynecology

## 2024-06-18 ENCOUNTER — Inpatient Hospital Stay: Payer: Commercial Managed Care - PPO | Attending: Obstetrics and Gynecology

## 2024-06-18 VITALS — BP 150/91 | HR 88 | Temp 97.6°F | Resp 17 | Wt 275.9 lb

## 2024-06-18 DIAGNOSIS — Z86018 Personal history of other benign neoplasm: Secondary | ICD-10-CM | POA: Diagnosis not present

## 2024-06-18 DIAGNOSIS — Z9079 Acquired absence of other genital organ(s): Secondary | ICD-10-CM | POA: Diagnosis not present

## 2024-06-18 DIAGNOSIS — D270 Benign neoplasm of right ovary: Secondary | ICD-10-CM

## 2024-06-18 DIAGNOSIS — Z90722 Acquired absence of ovaries, bilateral: Secondary | ICD-10-CM | POA: Insufficient documentation

## 2024-06-18 DIAGNOSIS — Z90710 Acquired absence of both cervix and uterus: Secondary | ICD-10-CM | POA: Diagnosis not present

## 2024-06-18 DIAGNOSIS — N83201 Unspecified ovarian cyst, right side: Secondary | ICD-10-CM

## 2024-06-19 LAB — CA 125: Cancer Antigen (CA) 125: 15.3 U/mL (ref 0.0–38.1)

## 2025-06-17 ENCOUNTER — Other Ambulatory Visit

## 2025-06-17 ENCOUNTER — Ambulatory Visit
